# Patient Record
Sex: Male | Born: 1945 | Race: White | Hispanic: No | Smoking: Former smoker
Health system: Southern US, Community
[De-identification: ages and names within clinical notes are randomized; demographics above are authoritative.]

## PROBLEM LIST (undated history)

## (undated) DIAGNOSIS — E119 Type 2 diabetes mellitus without complications: Secondary | ICD-10-CM

## (undated) DIAGNOSIS — F32A Depression, unspecified: Secondary | ICD-10-CM

## (undated) DIAGNOSIS — N400 Enlarged prostate without lower urinary tract symptoms: Secondary | ICD-10-CM

## (undated) DIAGNOSIS — Z8679 Personal history of other diseases of the circulatory system: Secondary | ICD-10-CM

## (undated) DIAGNOSIS — M1990 Unspecified osteoarthritis, unspecified site: Secondary | ICD-10-CM

## (undated) DIAGNOSIS — I10 Essential (primary) hypertension: Secondary | ICD-10-CM

## (undated) DIAGNOSIS — F329 Major depressive disorder, single episode, unspecified: Secondary | ICD-10-CM

## (undated) DIAGNOSIS — G4730 Sleep apnea, unspecified: Secondary | ICD-10-CM

## (undated) DIAGNOSIS — R0600 Dyspnea, unspecified: Secondary | ICD-10-CM

## (undated) DIAGNOSIS — E039 Hypothyroidism, unspecified: Secondary | ICD-10-CM

## (undated) DIAGNOSIS — Z8719 Personal history of other diseases of the digestive system: Secondary | ICD-10-CM

## (undated) HISTORY — PX: SHOULDER SURGERY: SHX246

## (undated) HISTORY — PX: OTHER SURGICAL HISTORY: SHX169

## (undated) HISTORY — PX: NEPHRECTOMY: SHX65

## (undated) HISTORY — PX: JOINT REPLACEMENT: SHX530

## (undated) HISTORY — PX: HERNIA REPAIR: SHX51

---

## 1998-06-01 ENCOUNTER — Other Ambulatory Visit: Admission: RE | Admit: 1998-06-01 | Discharge: 1998-06-01 | Payer: Self-pay

## 2000-12-08 ENCOUNTER — Encounter: Payer: Self-pay | Admitting: Orthopedic Surgery

## 2000-12-08 ENCOUNTER — Encounter: Admission: RE | Admit: 2000-12-08 | Discharge: 2000-12-08 | Payer: Self-pay | Admitting: Orthopedic Surgery

## 2002-02-08 ENCOUNTER — Encounter: Payer: Self-pay | Admitting: Orthopedic Surgery

## 2002-02-09 ENCOUNTER — Inpatient Hospital Stay (HOSPITAL_COMMUNITY): Admission: RE | Admit: 2002-02-09 | Discharge: 2002-02-12 | Payer: Self-pay | Admitting: Orthopedic Surgery

## 2003-02-11 ENCOUNTER — Ambulatory Visit (HOSPITAL_COMMUNITY): Admission: RE | Admit: 2003-02-11 | Discharge: 2003-02-11 | Payer: Self-pay | Admitting: Gastroenterology

## 2007-05-18 ENCOUNTER — Inpatient Hospital Stay (HOSPITAL_COMMUNITY): Admission: RE | Admit: 2007-05-18 | Discharge: 2007-05-21 | Payer: Self-pay | Admitting: Orthopedic Surgery

## 2009-09-05 ENCOUNTER — Ambulatory Visit: Payer: Self-pay

## 2010-07-17 NOTE — Op Note (Signed)
NAMEJEROMEY, KRUER NO.:  [id number]   MEDICAL RECORD NO.:  [id number]          PATIENT TYPE:  INP   LOCATION:  2899                         FACILITY:  [facility]   PHYSICIAN:  Mila Homer. Sherlean Foot, M.D. DATE OF BIRTH:  04/08/1945   DATE OF PROCEDURE:  05/18/2007  DATE OF DISCHARGE:                               OPERATIVE REPORT   SURGEON:  Mila Homer. Sherlean Foot, M.D.   ASSISTANT:  Legrand Pitts. Duffy, P.A.   ANESTHESIA:  General.   PREOPERATIVE DIAGNOSIS:  Left knee osteoarthritis.   POSTOPERATIVE DIAGNOSIS:  Left knee osteoarthritis.   PROCEDURE:  Left total knee arthroplasty.   INDICATIONS FOR PROCEDURE:  The patient is 62-year white male with  failure of conservative measures for osteoarthritis of the left knee.  Informed consent obtained.   DESCRIPTION OF PROCEDURE:  The patient was laid supine and administered  general anesthesia and Foley catheter placed.  The left leg was prepped  and draped in the usual sterile fashion.  The extremity was  exsanguinated with the Esmarch and the tourniquet inflated to 350 mmHg  and set for an hour.   A fresh 10 blade was used to make a midline incision 6 inches in length.  A new 10 blade was used to make a median parapatellar arthrotomy and  perform a synovectomy.  I everted the patella and measured 25 mm thick.  I reamed down 9 mm with the 35 reamer and then drilled 3 lug holes and  with the 35 template in place recreated the 25 thickness.  I then went  into flexion.  I used the extramedullary alignment system on the tibia  and the intramedullary alignment system on the femur to make  perpendicular cuts in the anatomic axis of the tibia with 6-degree  valgus distal femoral cut on the femur.  I then sized to a size G pinned  to the 3-degree external rotation holes.  I then placed the four-in-one  cutting block into place and made the anterior, posterior, and chamfer  cuts.  I then placed the lamina spreader in the knee and  removed the  ACL, PCL, medial and lateral menisci, and posterior condylar  osteophytes.  I then placed a 10-mm spacer block in the knee had good  flexion/extension gap balance.  I then finished the femur with a size G  finishing block in the tibia with a size 5 tibial tray drilling keel.  I  then trialed with a 5 tibia, G femur, 10 insert, 35 patella and had good  flexion/extension gap balance and good patellar tracking.  I removed the  trial components and copiously irrigated.  I then cemented in the  components, removed excess cement, and allowed the cement to harden with  the leg in extension.  I then left a Hemovac deep to the arthrotomy  coming out superolaterally and a pain catheter coming out supermedial  and superficial to the arthrotomy.  I let the tourniquet down, obtained  hemostasis, and copiously irrigated again.  I then closed the arthrotomy  with figure-of-eight # 1 Vicryl sutures, buried 0 Vicryl sutures,  and a  subcuticular 2-0 Vicryl stitch.  I placed skin staples, Xeroform  dressing, sponges, sterile Webril, and TED stocking.   COMPLICATIONS:  None.   DRAINS:  One pain catheter and one Hemovac.           ______________________________  Mila Homer. Sherlean Foot, M.D.     SDL/MEDQ  D:  05/18/2007  T:  05/18/2007  Job:  [id number]

## 2010-07-20 NOTE — Op Note (Signed)
NAME:  Charles Allison, Charles Allison                          ACCOUNT NO.:  [id number]   MEDICAL RECORD NO.:  [id number]                   PATIENT TYPE:  INP   LOCATION:  2888                                 FACILITY:  [facility]   PHYSICIAN:  Katy Fitch. Naaman Plummer., M.D.          DATE OF BIRTH:  06-25-1945   DATE OF PROCEDURE:  02/09/2002  DATE OF DISCHARGE:                                 OPERATIVE REPORT   PREOPERATIVE DIAGNOSIS:  End-stage glenohumeral degenerative arthritis, left  shoulder, with bone-on-bone arthropathy and large marginal osteophytes and  glenoid fossa and humeral head with arthrogram-proven intact rotator cuff  and known chondrocalcinosis of humeral head and rotator cuff tendinous  calcification.   POSTOPERATIVE DIAGNOSIS:  End-stage glenohumeral degenerative arthritis,  left shoulder, with bone-on-bone arthropathy and large marginal osteophytes  and glenoid fossa and humeral head with arthrogram-proven intact rotator  cuff and known chondrocalcinosis of humeral head and rotator cuff tendinous  calcification.   OPERATION:  Left Bio-Modular total shoulder implant arthroplasty utilizing  an 11-mm press-fit porous-coated stem, a 54 x 22-mm cobalt chrome head and a  medium 4-mm-thick polyethylene glenoid.   OPERATING SURGEON:  Katy Fitch. Sypher, M.D.   ASSISTANT:  Jonni Sanger, P.A.   ANESTHESIA:  General orotracheal.   SUPERVISING ANESTHESIOLOGIST:  Maren Beach, M.D.   ESTIMATED BLOOD LOSS:  350 cc, no replacement.   FLUIDS:  See anesthesia sheets.   COMPLICATIONS:  There were no apparent complications.   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   He has had progressive bilateral shoulder arthritis, left worse than right.   He presented for evaluation and management of his shoulder more than six  months ago and was noted at that time to have bone-on-bone arthropathy.   In  2002, he had had an arthrogram performed of his shoulder by Dr. Lenard Galloway.  Mortenson, confirming an intact rotator cuff.   He has seen a number of physicians, who have all recommended proceeding with  implant arthroplasty of the left shoulder with primary indication -- pain  relief, secondary indication -- regaining improved motion, strength and  prehensile function.   After informed consent, he has elected to proceed with reconstructive  surgery through the [facility] [place].   Preoperative questions were invited and answered.  He was informed of  potential risks and benefits of surgery including anesthetic risks, risks of  infection, loosening of the implants, fracture of the implants, polyethylene  wear and the need for later revision due to his age of 57 years.   After his questions were satisfactorily answered, and his preoperative  complications, we proceed with surgery at this time.   DESCRIPTION OF PROCEDURE:  The patient was brought to the operating room and  placed in supine position on the operating table.   Following induction of general orotracheal anesthesia, he was  carefully  positioned in a beach chair position with the aid of a torso and head holder  designed specifically for patients weighing up to 350 pounds for shoulder  arthroscopy and reconstructive shoulder surgery.   All bony prominences were carefully protected, followed by administration of  1 g of Ancef as an IV prophylactic antibiotic.   A foam pad was placed beneath the left scapula to bring the glenoid forward  for glenoid preparation.   The entire left upper extremity and forequarter were prepped with Duraprep  and draped with appropriate arthroscopy drapes.   Procedure commenced with an incision extending from above the coracoid to  the pectoralis major insertion along the deltopectoral groove.  The interval  between the deltoid and pectoralis major muscle was carefully dissected with   cautery and Metzenbaum scissors until the cephalic vein was identified.   The cephalic vein was mobilized and retracted medially, taking care to  suture-ligate four perforators to the deltoid.   A Cobb elevator and sponge were used to clear the clavipectoral fascia,  followed by identification of the conjoined tendon.   A portion of the conjoined tendon to the coracobrachialis was released with  the cutting cautery.   The supraspinatus was easily visualized, utilizing an elevator deep to the  coracoacromial ligament.  A hypertrophic bursa was not noted.  The rotator  cuff appeared to be in satisfactory condition.   A small portion of the coracoacromial ligament was released off the coracoid  to facilitate visualization of the joint.   A self-retaining retractor was placed, taking care to protect the cephalic  vein.  The deltoid was elevated with blunt dissection and a Richardson-type  retractor was used to expose the humeral head.   A stay suture of #2 Kevlar was placed in the subscapularis and the cutting  cautery was used to take the subscapularis down off of the lesser  tuberosity.  Care was taken to identify the long head of the biceps and  protect it throughout dissection.   Given the patient's muscular habitus and weight in excess of 260 pounds, we  were quite challenged to visualize the glenohumeral joint.  Approximately  one-half of the pectoralis major tendon was released off of the humerus to  facilitate exposure and projection of the humeral head forward.   With progressive external rotation, the joint was entered.   A very large marginal osteophyte was noted surrounding the humeral articular  surface; this was meticulously removed with the 4- and 6-mm osteotomes until  the native shaft of the humerus was evident.  Great care was taken to palpate the axillary nerve and place a series of  curved Joker and Crego-type retractors to protect the axillary nerve   throughout dissection.  Great care was also taken to prevent excessive  external rotation, stretching the capsule.   Once the humeral head was carefully identified, a second curved Crego  retractor was placed deep to the rotator cuff and the cutting guide was used  to determine the proper position of the humeral head cut.   The humeral head was removed, placing the arm in a position of 30 degrees  external rotation at a 55 degree angle.  Very satisfactory resection was  achieved, removing all the periarticular osteophytes.   A rongeur was used to carefully nibble the posterior osteophytes on the  humeral neck.   The glenohumeral joint was carefully lavaged with sterile saline, followed  by placement of a Fukuda retractor to visualize the glenoid  and a pitchfork-  type retractor to expose the anteromedial glenoid.   There was complete loss of the hyaline articular cartilage in the glenoid  fossa and the humeral head was noted to be down to the subchondral bone.   The [employer] technical representative measured the head and determined that a  55 x 22-mm head replacement would likely be appropriate.   I elected to prepare the humeral stem and leave the trial in place while we  prepared the glenoid.   The humeral shaft was delivered, taking great care to protect the long head  of the biceps and supraspinatus tendon, followed by use of sequential  reamers to ream from 6 mm to an attempt at 12 mm.   The 12-mm reamer could not be seated, despite our preoperative x-ray  measurements; therefore, we elected to proceed with press-fit of an 11-mm  stem.   The appropriate broaches were used to size the proximal humerus to accept an  11-mm porous-coated implant, followed by careful lavage with sterile saline  and triple-antibiotic solution.  The trial was placed to protect the humerus  during glenoid preparation.   With the use of a Fukuda retractor to expose the posterior glenoid and an   anterior pitchfork, a power bur was used to decorticate the entire glenoid,  followed by creating a recess for cement in the shape of a medium 4-mm  glenoid polyethylene replacement.   Given the tight soft tissues, a metal-backed glenoid did not appear to be an  appropriate choice.   The glenoid was recessed to a level of bleeding cancellous bone, followed by  placement of four cement fixation drill holes and undermining to accept the  glenoid keel.   The medium glenoid implant was placed on a trial basis and appeared to seat  satisfactorily.  The trial head was placed on the stem and the joint  reduced.   A satisfactory reduction was achieved with elevation of the shoulder joint  of 60 degrees and external rotation with the subscapularis released to at  least 60 degrees.  The trial implants were removed, followed by lavage of the shaft with triple-  antibiotic solution and placement of an 11-mm stem with press-fit technique.  The glenoid was placed with bone cement and held into position for a total  of 13 minutes until the cement had thoroughly set.  The cement was cooled  with irrigation throughout curing.   Excess cement was removed from the periglenoid region with the use of small  curettes and osteotomes.   The glenoid was carefully lavaged, with removal of all residual  calcification visible with rongeurs and fine forceps.  The inferior capsule  was gently released off the neck of the humerus with a 4-mm osteotome,  taking care to avoid the axillary nerve.   This improved external rotation as well as elevation.   The cobalt chrome head was placed after routine preparation of the stem with  drying of the reversed Morse taper and use of a sponge to clear all debris.  The head was placed and tamped with three sharp taps with a mallet and the  appropriate impacter.  The joint was reduced and very satisfactory stability  was achieved.  There was only approximately a 15%  inferior translation with  inferior traction and external rotation to at least 60 degrees with proper  stability.   The wound was then prepared for closure by placing a total of three #2  Kevlar sutures, mattress technique,  into the anterior neck of the humerus,  followed by use of the stay suture to advance the subscapularis to an  anatomic position.   The subscapularis was repaired after placement of a 3-mm suction drain into  the inferior capsule for postoperative drainage of hematoma.   The capsule was repaired anatomically with a total of three through-bone  mattress sutures and a fourth finishing suture into the intertubercular  ligament.  Very satisfactory excursion of the subscapularis deep to the  coracoid was achieved.   The rotator interval was then smoothed with a mattress suture of #2 Kevlar  with knots buried.   External rotation after completion of the subscapularis repair was noted to  be only 20 degrees.  Forward elevation to 150 degrees was easily recovered.   The wound was then repaired with subdermal sutures of 2-0 Vicryl, followed  by an intradermal 3-0 Prolene.   The suction drain was hooked to a bulb and the wound dressed with Xeroflo,  sterile gauze and a Hypafix dressing.   For aftercare, the patient was awakened from general anesthesia and  transferred to the recovery room with stable vital signs.  He will admitted  to the hospital for IV antibiotic therapy times a minimum of 48 hours and  appropriate analgesics in the form of IV and PCA morphine and/or IV and p.o.  Dilaudid.   He will be monitored for routine care of his type 2 diabetes with cutaneous  blood glucose measurement, as well as serial observation of his hematocrit  x48 to 72 hours.                                                 Katy Fitch Naaman Plummer., M.D.    RVS/MEDQ  D:  02/09/2002  T:  02/09/2002  Job:  [id number]

## 2010-07-20 NOTE — Discharge Summary (Signed)
Charles Allison, Charles Allison NO.:  [id number]   MEDICAL RECORD NO.:  [id number]          PATIENT TYPE:  INP   LOCATION:  5008                         FACILITY:  [facility]   PHYSICIAN:  Oris Drone. Petrarca, P.A.-C.DATE OF BIRTH:  [birth year]   DATE OF ADMISSION:  05/18/2007  DATE OF DISCHARGE:  05/21/2007                               DISCHARGE SUMMARY   ADMISSION DIAGNOSIS:  Osteoarthritis to the left knee.   DISCHARGE DIAGNOSES:  1. Osteoarthritis to the left knee.  2. Osteoarthritis, right knee.  3. Hypertension.  4. Type 2 diabetes mellitus.  5. Benign prostatic hypertrophy.  6. Dyslipidemia.  7. Gastroesophageal reflux disease.  8. Obesity.  9. Hearing loss.  10.Sleep apnea.  11.Anxiety.  12.Degenerative disease of cervical spine.  13.Acute blood loss anemia.   PROCEDURE:  Left total knee arthroplasty.   HISTORY:  Mr. Mcgowan is a 65 year old white male who presents with  bilateral knee pain, left greater than right, it came on gradually and  has been getting  progressively worse.  He has a history of right knee  arthroscopy in 1989.  Denies any injuries.  His left knee, he has pain  that is constant and throbbing and aching over the anterior joint  without radiation.  Severity does vary.  Increases with standing and  weightbearing and decreases with Tylenol and rest.  There is mechanical  symptoms of popping, grinding.  Edema and nighttime pain is also a  problem.  He started ibuprofen and Tylenol with some relief.  He has not  had any corticosteroids or viscous supplementation at this time.  Indicated for left total knee arthroplasty.   HOSPITAL COURSE:  A 65 year old male admitted on May 18, 2007, after  appropriate laboratory studies were obtained as well as 2 g Ancef IV on  call to the operating room.  He was taken to the operating room, where  he underwent a left total knee arthroplasty.  This was performed by Dr.  Georgena Spurling, assisted by Legrand Pitts.  Duffy, PA-C.  He tolerated the  procedure well.  He was continued on Ancef 1 g IV q.6 h. x3 doses.  He  was started on Lovenox 30 mg subcutaneous q.12 h. beginning on May 19, 2007, at 8 a.m.  Foley was placed intraoperatively.  CPM was started 0  to 9 degrees for 6 to 8 hours per day.  Consultation with PT/OT care  management were made.  Weightbearing as tolerated.  CPAP was used  postoperatively.  His metformin was held until his postop day 1 and then  it was restarted.  He was noted to have decrease in his platelet count  and a HIT screen was obtained on postop day 2, which was negative.  He  was weaned off his PCA.  Lovenox instructions were given.  Dietician  consultation was ordered.  Remainder of his hospital course was  uneventful except for hypokalemia, which was treated with 40 mEq of KCl  prior to his discharge.  Discharged to home on May 21, 2007, to return  back to the office 2 weeks postoperatively.  EKG was read as normal  sinus rhythm with nonspecific ST abnormality.   LABORATORY STUDIES:  Hemoglobin 14.5, hematocrit 42.9%, white count  5600, and platelets 184,000.  Discharge hemoglobin 10.9, hematocrit  31.9%, white count 5700, and platelets 143,000.  HIT screen was  negative.  Preop sodium 141, potassium 4.5, chloride 108, CO2 of 24,  glucose 174, BUN 21, creatinine 1.12, and GFR was greater than 60.  Discharge sodium 138, potassium 3.3, chloride 102, CO2 of 27, glucose  125, BUN 14, creatinine 0.90, and GFR greater than 60.  Preop total  protein 7.0, albumin 4.1, AST 26, ALT 25, ALP 54, total bilirubin 0.8,  and hemoglobin A1c was 6.2.  Urinalysis was benign for voided urine.  Blood type B positive and antibody screen negative.  No growth on urine  culture.   DISCHARGE INSTRUCTIONS:  He is to resume his on regular diet at home.  Increase his activity slowly.  Use his crutches, weightbearing as  tolerated.  He may shower on Friday.  No lifting or driving for 6  weeks.  CPM 0 to 9 degrees for 6 to 8 hours per day and increasing 5 to 10  degrees daily to 110 degrees.  He will follow blue knee instruction  sheet.   DISCHARGE MEDICATION:  1. Prescription for Lovenox 40 mg injecting at 8 a.m. daily.  Last      dose being on June 02, 2007.  2. Percocet 5/325 1 to 2 tablets every 4 hours as needed for pain.  3. Robaxin 500 mg 1 tablet every 6 hours as needed for spasm and      continue with all his preop medicines as indicated on the home      reconciliation sheet.   FOLLOWUP:  He is to follow up with Dr. Sherlean Foot Tuesday on June 02, 2007.  He was discharged in improved condition.      Oris Drone Petrarca, P.A.-C.     BDP/MEDQ  D:  06/09/2007  T:  06/09/2007  Job:  [id number]

## 2010-11-26 LAB — BASIC METABOLIC PANEL
BUN: 14
Calcium: 9.1
Chloride: 101
Chloride: 102
Creatinine, Ser: 1.02
Creatinine, Ser: 1.16
GFR calc Af Amer: >60
GFR calc Af Amer: >60
GFR calc non Af Amer: >60
GFR calc non Af Amer: >60
GFR calc non Af Amer: >60
Potassium: 3.3 — ABNORMAL LOW
Potassium: 3.7
Sodium: 138
Sodium: 139

## 2010-11-26 LAB — CROSSMATCH
ABO/RH(D): B POS
Antibody Screen: NEGATIVE

## 2010-11-26 LAB — HEMOGLOBIN A1C
Hgb A1c MFr Bld: 6.2 — ABNORMAL HIGH
Mean Plasma Glucose: 143

## 2010-11-26 LAB — URINE CULTURE
Colony Count: NO GROWTH
Culture: NO GROWTH

## 2010-11-26 LAB — COMPREHENSIVE METABOLIC PANEL
Albumin: 4.1
Alkaline Phosphatase: 54
BUN: 21
Chloride: 108
Glucose, Bld: 174 — ABNORMAL HIGH
Potassium: 4.5
Total Bilirubin: 0.8

## 2010-11-26 LAB — CBC
HCT: 42.9
HCT: 31.9 — ABNORMAL LOW
HCT: 34.1 — ABNORMAL LOW
Hemoglobin: 14.5
Hemoglobin: 10.9 — ABNORMAL LOW
Hemoglobin: 12.7 — ABNORMAL LOW
MCV: 86.6
MCV: 87
Platelets: 184
Platelets: 143 — ABNORMAL LOW
RBC: 3.94 — ABNORMAL LOW
RBC: 4.27
WBC: 5.6
WBC: 5.7
WBC: 6.9

## 2010-11-26 LAB — DIFFERENTIAL
Basophils Absolute: 0
Basophils Relative: 1
Eosinophils Absolute: 0.1
Monocytes Absolute: 0.4
Neutro Abs: 3.1

## 2010-11-26 LAB — URINALYSIS, ROUTINE W REFLEX MICROSCOPIC
Bilirubin Urine: NEGATIVE
Glucose, UA: NEGATIVE
Hgb urine dipstick: NEGATIVE
Ketones, ur: NEGATIVE
pH: 5

## 2010-11-26 LAB — HEPARIN ANTIBODY SCREEN: Heparin Antibody Screen: NEGATIVE

## 2010-11-26 LAB — ABO/RH: ABO/RH(D): B POS

## 2010-11-26 LAB — APTT: aPTT: 33

## 2012-02-11 ENCOUNTER — Encounter (HOSPITAL_COMMUNITY): Payer: Self-pay | Admitting: Pharmacy Technician

## 2012-02-11 ENCOUNTER — Other Ambulatory Visit: Payer: Self-pay | Admitting: Orthopedic Surgery

## 2012-02-14 ENCOUNTER — Encounter (HOSPITAL_COMMUNITY)
Admission: RE | Admit: 2012-02-14 | Discharge: 2012-02-14 | Disposition: A | Discharge status: Home / Self Care | Payer: Non-veteran care | Source: Ambulatory Visit | Attending: Orthopedic Surgery | Admitting: Orthopedic Surgery

## 2012-02-14 ENCOUNTER — Encounter (HOSPITAL_COMMUNITY): Payer: Self-pay

## 2012-02-14 HISTORY — DX: Essential (primary) hypertension: I10

## 2012-02-14 HISTORY — DX: Sleep apnea, unspecified: G47.30

## 2012-02-14 HISTORY — DX: Unspecified osteoarthritis, unspecified site: M19.90

## 2012-02-14 HISTORY — DX: Personal history of other diseases of the digestive system: Z87.19

## 2012-02-14 HISTORY — DX: Type 2 diabetes mellitus without complications: E11.9

## 2012-02-14 LAB — CBC WITH DIFFERENTIAL/PLATELET
Basophils Absolute: 0 10*3/uL (ref 0.0–0.1)
Basophils Relative: 0 % (ref 0–1)
Eosinophils Relative: 2 % (ref 0–5)
Lymphocytes Relative: 31 % (ref 12–46)
MCHC: 33.2 g/dL (ref 30.0–36.0)
MCV: 86.7 fL (ref 78.0–100.0)
Neutro Abs: 3.7 10*3/uL (ref 1.7–7.7)
Platelets: 161 10*3/uL (ref 150–400)
RDW: 13.7 % (ref 11.5–15.5)
WBC: 6.1 10*3/uL (ref 4.0–10.5)

## 2012-02-14 LAB — PROTIME-INR: INR: 1.04 (ref 0.00–1.49)

## 2012-02-14 LAB — URINALYSIS, ROUTINE W REFLEX MICROSCOPIC
Glucose, UA: NEGATIVE mg/dL
Leukocytes, UA: NEGATIVE
Nitrite: NEGATIVE
Specific Gravity, Urine: 1.021 (ref 1.005–1.030)
pH: 5.5 (ref 5.0–8.0)

## 2012-02-14 LAB — APTT: aPTT: 34 seconds (ref 24–37)

## 2012-02-14 LAB — COMPREHENSIVE METABOLIC PANEL
ALT: 44 U/L (ref 0–53)
AST: 39 U/L — ABNORMAL HIGH (ref 0–37)
Albumin: 3.9 g/dL (ref 3.5–5.2)
CO2: 28 mEq/L (ref 19–32)
Calcium: 10.1 mg/dL (ref 8.4–10.5)
GFR calc non Af Amer: 73 mL/min — ABNORMAL LOW (ref >90)
Sodium: 140 mEq/L (ref 135–145)

## 2012-02-14 LAB — TYPE AND SCREEN: Antibody Screen: NEGATIVE

## 2012-02-14 NOTE — Pre-Procedure Instructions (Signed)
20 DONTRAIL BLACKWELL  02/14/2012   Your procedure is scheduled on:  Monday, December 16th  Report to [facility] at 5:30 AM.  Call this number if you have problems the morning of surgery: [phone]   Remember:   Do not eat food or drink any liquids:After Midnight Sunday.   Take these medicines the morning of surgery with A SIP OF WATER: Metoprolol, Omeprazole, Prozac   Do not wear jewelry.  Do not wear lotions, powders, or cologne. You may NOT wear deodorant.  Men may shave face and neck.   Do not bring valuables to the hospital.  Contacts, dentures or bridgework may not be worn into surgery.   Leave suitcase in the car. After surgery it may be brought to your room.  For patients admitted to the hospital, checkout time is 11:00 AM the day of discharge.   Patients discharged the day of surgery will not be allowed to drive home.  Name and phone number of your driver:    Special Instructions: Shower using CHG 2 nights before surgery and the night before surgery.  If you shower the day of surgery use CHG.  Use special wash - you have one bottle of CHG for all showers.  You should use approximately 1/3 of the bottle for each shower.   Please read over the following fact sheets that you were given: Pain Booklet, Coughing and Deep Breathing, Blood Transfusion Information, MRSA Information and Surgical Site Infection Prevention

## 2012-02-16 LAB — URINE CULTURE: Colony Count: NO GROWTH

## 2012-02-16 MED ORDER — ACETAMINOPHEN 10 MG/ML IV SOLN
1000.0000 mg | Freq: Four times a day (QID) | INTRAVENOUS | Status: DC
  Administered 2012-02-17: 1000 mg via INTRAVENOUS
  Filled 2012-02-16 (×4): qty 100

## 2012-02-16 MED ORDER — CHLORHEXIDINE GLUCONATE 4 % EX LIQD: 60.0000 mL | Freq: Once | CUTANEOUS | Status: DC

## 2012-02-16 MED ORDER — DEXTROSE 5 % IV SOLN
3.0000 g | INTRAVENOUS | Status: AC
  Administered 2012-02-17: 3 g via INTRAVENOUS
  Filled 2012-02-16: qty 3000

## 2012-02-16 MED ORDER — SODIUM CHLORIDE 0.9 % IV SOLN: INTRAVENOUS | Status: DC

## 2012-02-17 ENCOUNTER — Encounter (HOSPITAL_COMMUNITY): Payer: Self-pay | Admitting: Anesthesiology

## 2012-02-17 ENCOUNTER — Inpatient Hospital Stay (HOSPITAL_COMMUNITY)
Admission: RE | Admit: 2012-02-17 | Discharge: 2012-02-20 | DRG: 470 | Disposition: A | Discharge status: Home / Self Care | Payer: Non-veteran care | Source: Ambulatory Visit | Attending: Orthopedic Surgery | Admitting: Orthopedic Surgery

## 2012-02-17 ENCOUNTER — Encounter (HOSPITAL_COMMUNITY): Payer: Self-pay | Admitting: Surgery

## 2012-02-17 ENCOUNTER — Ambulatory Visit (HOSPITAL_COMMUNITY): Payer: Non-veteran care | Admitting: Anesthesiology

## 2012-02-17 ENCOUNTER — Encounter (HOSPITAL_COMMUNITY)
Admission: RE | Disposition: A | Discharge status: Home / Self Care | Payer: Self-pay | Source: Ambulatory Visit | Attending: Orthopedic Surgery

## 2012-02-17 DIAGNOSIS — E119 Type 2 diabetes mellitus without complications: Secondary | ICD-10-CM | POA: Diagnosis present

## 2012-02-17 DIAGNOSIS — M1710 Unilateral primary osteoarthritis, unspecified knee: Principal | ICD-10-CM | POA: Diagnosis present

## 2012-02-17 DIAGNOSIS — Z79899 Other long term (current) drug therapy: Secondary | ICD-10-CM

## 2012-02-17 DIAGNOSIS — D62 Acute posthemorrhagic anemia: Secondary | ICD-10-CM | POA: Diagnosis not present

## 2012-02-17 DIAGNOSIS — Z96619 Presence of unspecified artificial shoulder joint: Secondary | ICD-10-CM

## 2012-02-17 DIAGNOSIS — Z96659 Presence of unspecified artificial knee joint: Secondary | ICD-10-CM

## 2012-02-17 DIAGNOSIS — M1711 Unilateral primary osteoarthritis, right knee: Secondary | ICD-10-CM

## 2012-02-17 DIAGNOSIS — Z87891 Personal history of nicotine dependence: Secondary | ICD-10-CM

## 2012-02-17 DIAGNOSIS — I10 Essential (primary) hypertension: Secondary | ICD-10-CM | POA: Diagnosis present

## 2012-02-17 HISTORY — DX: Depression, unspecified: F32.A

## 2012-02-17 HISTORY — DX: Major depressive disorder, single episode, unspecified: F32.9

## 2012-02-17 HISTORY — DX: Personal history of other diseases of the circulatory system: Z86.79

## 2012-02-17 HISTORY — PX: TOTAL KNEE ARTHROPLASTY: SHX125

## 2012-02-17 LAB — HEMOGLOBIN A1C
Hgb A1c MFr Bld: 8.1 % — ABNORMAL HIGH (ref <5.7)
Mean Plasma Glucose: 186 mg/dL — ABNORMAL HIGH (ref <117)

## 2012-02-17 SURGERY — ARTHROPLASTY, KNEE, TOTAL
Anesthesia: Regional | Site: Knee | Laterality: Right | Wound class: Clean

## 2012-02-17 MED ORDER — ONDANSETRON HCL 4 MG/2ML IJ SOLN: 4.0000 mg | Freq: Once | INTRAMUSCULAR | Status: DC | PRN

## 2012-02-17 MED ORDER — GLIPIZIDE 10 MG PO TABS
10.0000 mg | ORAL_TABLET | Freq: Every day | ORAL | Status: DC
  Administered 2012-02-17 – 2012-02-19 (×3): 10 mg via ORAL
  Filled 2012-02-17 (×5): qty 1

## 2012-02-17 MED ORDER — METOCLOPRAMIDE HCL 5 MG/ML IJ SOLN: 5.0000 mg | Freq: Three times a day (TID) | INTRAMUSCULAR | Status: DC | PRN

## 2012-02-17 MED ORDER — METOCLOPRAMIDE HCL 10 MG PO TABS: 5.0000 mg | ORAL_TABLET | Freq: Three times a day (TID) | ORAL | Status: DC | PRN

## 2012-02-17 MED ORDER — FLEET ENEMA 7-19 GM/118ML RE ENEM: 1.0000 | ENEMA | Freq: Once | RECTAL | Status: AC | PRN

## 2012-02-17 MED ORDER — DIAZEPAM 5 MG/ML IJ SOLN
5.0000 mg | Freq: Once | INTRAMUSCULAR | Status: AC
  Administered 2012-02-17: 5 mg via INTRAVENOUS

## 2012-02-17 MED ORDER — ONDANSETRON HCL 4 MG/2ML IJ SOLN: 4.0000 mg | Freq: Four times a day (QID) | INTRAMUSCULAR | Status: DC | PRN

## 2012-02-17 MED ORDER — HYDROMORPHONE HCL PF 1 MG/ML IJ SOLN
INTRAMUSCULAR | Status: AC
  Filled 2012-02-17: qty 2

## 2012-02-17 MED ORDER — GLYCOPYRROLATE 0.2 MG/ML IJ SOLN
INTRAMUSCULAR | Status: DC | PRN
  Administered 2012-02-17 (×2): 0.2 mg via INTRAVENOUS

## 2012-02-17 MED ORDER — METHOCARBAMOL 100 MG/ML IJ SOLN: 500.0000 mg | Freq: Four times a day (QID) | INTRAVENOUS | Status: DC | PRN

## 2012-02-17 MED ORDER — ARTIFICIAL TEARS OP OINT
TOPICAL_OINTMENT | OPHTHALMIC | Status: DC | PRN
  Administered 2012-02-17: 1 via OPHTHALMIC

## 2012-02-17 MED ORDER — NEOSTIGMINE METHYLSULFATE 1 MG/ML IJ SOLN
INTRAMUSCULAR | Status: DC | PRN
  Administered 2012-02-17: 1 mg via INTRAVENOUS

## 2012-02-17 MED ORDER — OXYCODONE HCL ER 10 MG PO T12A
10.0000 mg | EXTENDED_RELEASE_TABLET | Freq: Two times a day (BID) | ORAL | Status: DC
  Administered 2012-02-17 – 2012-02-18 (×3): 10 mg via ORAL
  Filled 2012-02-17 (×3): qty 1

## 2012-02-17 MED ORDER — BUPIVACAINE ON-Q PAIN PUMP (FOR ORDER SET NO CHG)
INJECTION | Status: AC
  Filled 2012-02-17: qty 1

## 2012-02-17 MED ORDER — ONDANSETRON HCL 4 MG PO TABS: 4.0000 mg | ORAL_TABLET | Freq: Four times a day (QID) | ORAL | Status: DC | PRN

## 2012-02-17 MED ORDER — LISINOPRIL 5 MG PO TABS
5.0000 mg | ORAL_TABLET | Freq: Every day | ORAL | Status: DC
  Administered 2012-02-17 – 2012-02-20 (×4): 5 mg via ORAL
  Filled 2012-02-17 (×5): qty 1

## 2012-02-17 MED ORDER — BUPIVACAINE-EPINEPHRINE 0.25% -1:200000 IJ SOLN
INTRAMUSCULAR | Status: DC | PRN
  Administered 2012-02-17: 20 mL

## 2012-02-17 MED ORDER — GLIPIZIDE 10 MG PO TABS
20.0000 mg | ORAL_TABLET | Freq: Every day | ORAL | Status: DC
  Administered 2012-02-18 – 2012-02-20 (×3): 20 mg via ORAL
  Filled 2012-02-17 (×5): qty 2

## 2012-02-17 MED ORDER — ACETAMINOPHEN 10 MG/ML IV SOLN
INTRAVENOUS | Status: AC
  Filled 2012-02-17: qty 100

## 2012-02-17 MED ORDER — CEFAZOLIN SODIUM-DEXTROSE 2-3 GM-% IV SOLR
2.0000 g | Freq: Four times a day (QID) | INTRAVENOUS | Status: AC
  Administered 2012-02-17 – 2012-02-18 (×2): 2 g via INTRAVENOUS
  Filled 2012-02-17 (×3): qty 50

## 2012-02-17 MED ORDER — PHENOL 1.4 % MT LIQD: 1.0000 | OROMUCOSAL | Status: DC | PRN

## 2012-02-17 MED ORDER — DIAZEPAM 5 MG PO TABS
5.0000 mg | ORAL_TABLET | Freq: Three times a day (TID) | ORAL | Status: DC | PRN
  Administered 2012-02-17 – 2012-02-20 (×2): 10 mg via ORAL
  Filled 2012-02-17 (×2): qty 2

## 2012-02-17 MED ORDER — GLIPIZIDE 10 MG PO TABS: 10.0000 mg | ORAL_TABLET | Freq: Two times a day (BID) | ORAL | Status: DC

## 2012-02-17 MED ORDER — ONDANSETRON HCL 4 MG/2ML IJ SOLN
INTRAMUSCULAR | Status: DC | PRN
  Administered 2012-02-17: 4 mg via INTRAVENOUS

## 2012-02-17 MED ORDER — DIPHENHYDRAMINE HCL 12.5 MG/5ML PO ELIX: 12.5000 mg | ORAL_SOLUTION | ORAL | Status: DC | PRN

## 2012-02-17 MED ORDER — BISACODYL 5 MG PO TBEC: 5.0000 mg | DELAYED_RELEASE_TABLET | Freq: Every day | ORAL | Status: DC | PRN

## 2012-02-17 MED ORDER — DIAZEPAM 5 MG/ML IJ SOLN
INTRAMUSCULAR | Status: AC
  Filled 2012-02-17: qty 2

## 2012-02-17 MED ORDER — MENTHOL 3 MG MT LOZG: 1.0000 | LOZENGE | OROMUCOSAL | Status: DC | PRN

## 2012-02-17 MED ORDER — FENTANYL CITRATE 0.05 MG/ML IJ SOLN
INTRAMUSCULAR | Status: DC | PRN
  Administered 2012-02-17: 150 ug via INTRAVENOUS
  Administered 2012-02-17 (×2): 50 ug via INTRAVENOUS

## 2012-02-17 MED ORDER — LACTATED RINGERS IV SOLN
INTRAVENOUS | Status: DC | PRN
  Administered 2012-02-17 (×2): via INTRAVENOUS

## 2012-02-17 MED ORDER — SENNOSIDES-DOCUSATE SODIUM 8.6-50 MG PO TABS: 1.0000 | ORAL_TABLET | Freq: Every evening | ORAL | Status: DC | PRN

## 2012-02-17 MED ORDER — BUPIVACAINE HCL (PF) 0.25 % IJ SOLN
INTRAMUSCULAR | Status: AC
  Filled 2012-02-17: qty 30

## 2012-02-17 MED ORDER — DOCUSATE SODIUM 100 MG PO CAPS
100.0000 mg | ORAL_CAPSULE | Freq: Two times a day (BID) | ORAL | Status: DC
  Administered 2012-02-17 – 2012-02-20 (×6): 100 mg via ORAL
  Filled 2012-02-17 (×7): qty 1

## 2012-02-17 MED ORDER — INSULIN ASPART 100 UNIT/ML ~~LOC~~ SOLN
0.0000 [IU] | Freq: Three times a day (TID) | SUBCUTANEOUS | Status: DC
  Administered 2012-02-18: 2 [IU] via SUBCUTANEOUS
  Administered 2012-02-18: 3 [IU] via SUBCUTANEOUS
  Administered 2012-02-18: 2 [IU] via SUBCUTANEOUS
  Administered 2012-02-19 – 2012-02-20 (×3): 3 [IU] via SUBCUTANEOUS
  Administered 2012-02-20: 2 [IU] via SUBCUTANEOUS

## 2012-02-17 MED ORDER — BUPIVACAINE 0.25 % ON-Q PUMP SINGLE CATH 300ML
300.0000 mL | INJECTION | Status: DC
Start: 2012-02-17 — End: 2012-02-17
  Administered 2012-02-17: 300 mL
  Filled 2012-02-17: qty 300

## 2012-02-17 MED ORDER — HYDROMORPHONE HCL PF 1 MG/ML IJ SOLN
1.0000 mg | INTRAMUSCULAR | Status: DC | PRN
  Administered 2012-02-17 – 2012-02-18 (×6): 1 mg via INTRAVENOUS
  Filled 2012-02-17 (×6): qty 1

## 2012-02-17 MED ORDER — PIOGLITAZONE HCL 45 MG PO TABS
45.0000 mg | ORAL_TABLET | Freq: Every day | ORAL | Status: DC
  Administered 2012-02-18 – 2012-02-20 (×3): 45 mg via ORAL
  Filled 2012-02-17 (×4): qty 1

## 2012-02-17 MED ORDER — ALUM & MAG HYDROXIDE-SIMETH 200-200-20 MG/5ML PO SUSP: 30.0000 mL | ORAL | Status: DC | PRN

## 2012-02-17 MED ORDER — LIDOCAINE HCL (CARDIAC) 20 MG/ML IV SOLN
INTRAVENOUS | Status: DC | PRN
  Administered 2012-02-17: 80 mg via INTRAVENOUS

## 2012-02-17 MED ORDER — PHENYLEPHRINE HCL 10 MG/ML IJ SOLN
INTRAMUSCULAR | Status: DC | PRN
  Administered 2012-02-17 (×2): 80 ug via INTRAVENOUS
  Administered 2012-02-17: 40 ug via INTRAVENOUS
  Administered 2012-02-17: 80 ug via INTRAVENOUS

## 2012-02-17 MED ORDER — SODIUM CHLORIDE 0.9 % IR SOLN
Status: DC | PRN
  Administered 2012-02-17: 4000 mL

## 2012-02-17 MED ORDER — ENOXAPARIN SODIUM 30 MG/0.3ML ~~LOC~~ SOLN
30.0000 mg | Freq: Two times a day (BID) | SUBCUTANEOUS | Status: DC
  Administered 2012-02-18 – 2012-02-20 (×5): 30 mg via SUBCUTANEOUS
  Filled 2012-02-17 (×7): qty 0.3

## 2012-02-17 MED ORDER — PANTOPRAZOLE SODIUM 40 MG PO TBEC
40.0000 mg | DELAYED_RELEASE_TABLET | Freq: Every day | ORAL | Status: DC
  Administered 2012-02-18 – 2012-02-20 (×3): 40 mg via ORAL
  Filled 2012-02-17 (×5): qty 1

## 2012-02-17 MED ORDER — ACETAMINOPHEN 10 MG/ML IV SOLN
1000.0000 mg | Freq: Four times a day (QID) | INTRAVENOUS | Status: AC
  Administered 2012-02-17 – 2012-02-18 (×4): 1000 mg via INTRAVENOUS
  Filled 2012-02-17 (×4): qty 100

## 2012-02-17 MED ORDER — DOXAZOSIN MESYLATE 8 MG PO TABS
8.0000 mg | ORAL_TABLET | Freq: Every day | ORAL | Status: DC
  Administered 2012-02-18 – 2012-02-19 (×3): 8 mg via ORAL
  Filled 2012-02-17 (×5): qty 1

## 2012-02-17 MED ORDER — FLUOXETINE HCL 20 MG PO CAPS
60.0000 mg | ORAL_CAPSULE | Freq: Every day | ORAL | Status: DC
  Administered 2012-02-18 – 2012-02-20 (×3): 60 mg via ORAL
  Filled 2012-02-17 (×3): qty 3

## 2012-02-17 MED ORDER — ROCURONIUM BROMIDE 100 MG/10ML IV SOLN
INTRAVENOUS | Status: DC | PRN
  Administered 2012-02-17: 50 mg via INTRAVENOUS

## 2012-02-17 MED ORDER — METFORMIN HCL 500 MG PO TABS
1000.0000 mg | ORAL_TABLET | Freq: Two times a day (BID) | ORAL | Status: DC
  Administered 2012-02-17 – 2012-02-20 (×6): 1000 mg via ORAL
  Filled 2012-02-17 (×9): qty 2

## 2012-02-17 MED ORDER — CELECOXIB 200 MG PO CAPS
200.0000 mg | ORAL_CAPSULE | Freq: Two times a day (BID) | ORAL | Status: DC
  Administered 2012-02-18 – 2012-02-20 (×6): 200 mg via ORAL
  Filled 2012-02-17 (×8): qty 1

## 2012-02-17 MED ORDER — SODIUM CHLORIDE 0.9 % IV SOLN
INTRAVENOUS | Status: DC
  Administered 2012-02-17 – 2012-02-19 (×2): via INTRAVENOUS

## 2012-02-17 MED ORDER — PROPOFOL 10 MG/ML IV BOLUS
INTRAVENOUS | Status: DC | PRN
  Administered 2012-02-17: 200 mg via INTRAVENOUS

## 2012-02-17 MED ORDER — METOPROLOL TARTRATE 25 MG PO TABS
25.0000 mg | ORAL_TABLET | Freq: Two times a day (BID) | ORAL | Status: DC
  Administered 2012-02-17 – 2012-02-20 (×6): 25 mg via ORAL
  Filled 2012-02-17 (×7): qty 1

## 2012-02-17 MED ORDER — ZOLPIDEM TARTRATE 5 MG PO TABS: 5.0000 mg | ORAL_TABLET | Freq: Every evening | ORAL | Status: DC | PRN

## 2012-02-17 MED ORDER — METFORMIN HCL 500 MG PO TABS: 1000.0000 mg | ORAL_TABLET | Freq: Two times a day (BID) | ORAL | Status: DC

## 2012-02-17 MED ORDER — METHOCARBAMOL 500 MG PO TABS
500.0000 mg | ORAL_TABLET | Freq: Four times a day (QID) | ORAL | Status: DC | PRN
  Administered 2012-02-17 – 2012-02-20 (×8): 500 mg via ORAL
  Filled 2012-02-17 (×8): qty 1

## 2012-02-17 MED ORDER — HYDROMORPHONE HCL PF 1 MG/ML IJ SOLN
0.2500 mg | INTRAMUSCULAR | Status: DC | PRN
  Administered 2012-02-17 (×7): 0.5 mg via INTRAVENOUS

## 2012-02-17 MED ORDER — SIMVASTATIN 10 MG PO TABS
10.0000 mg | ORAL_TABLET | Freq: Every day | ORAL | Status: DC
  Administered 2012-02-17 – 2012-02-19 (×3): 10 mg via ORAL
  Filled 2012-02-17 (×4): qty 1

## 2012-02-17 MED ORDER — OXYCODONE HCL 5 MG PO TABS
5.0000 mg | ORAL_TABLET | ORAL | Status: DC | PRN
  Administered 2012-02-17: 10 mg via ORAL
  Administered 2012-02-18: 5 mg via ORAL
  Administered 2012-02-18: 10 mg via ORAL
  Filled 2012-02-17: qty 1
  Filled 2012-02-17 (×2): qty 2

## 2012-02-17 MED ORDER — LIDOCAINE HCL 4 % MT SOLN
OROMUCOSAL | Status: DC | PRN
  Administered 2012-02-17: 4 mL via TOPICAL

## 2012-02-17 MED ORDER — METHOCARBAMOL 100 MG/ML IJ SOLN
500.0000 mg | INTRAVENOUS | Status: AC
  Administered 2012-02-17: 500 mg via INTRAVENOUS
  Filled 2012-02-17: qty 5

## 2012-02-17 MED ORDER — ACETAMINOPHEN 325 MG PO TABS
650.0000 mg | ORAL_TABLET | Freq: Four times a day (QID) | ORAL | Status: DC | PRN
  Administered 2012-02-19: 650 mg via ORAL
  Filled 2012-02-17: qty 2

## 2012-02-17 MED ORDER — ACETAMINOPHEN 650 MG RE SUPP: 650.0000 mg | Freq: Four times a day (QID) | RECTAL | Status: DC | PRN

## 2012-02-17 MED ORDER — FAMOTIDINE 20 MG PO TABS
20.0000 mg | ORAL_TABLET | Freq: Every day | ORAL | Status: DC
  Administered 2012-02-17 – 2012-02-20 (×4): 20 mg via ORAL
  Filled 2012-02-17 (×4): qty 1

## 2012-02-17 SURGICAL SUPPLY — 57 items
BANDAGE ESMARK 6X9 LF (GAUZE/BANDAGES/DRESSINGS) ×1 IMPLANT
BLADE SAGITTAL 13X1.27X60 (BLADE) ×2 IMPLANT
BLADE SAW SGTL 83.5X18.5 (BLADE) ×2 IMPLANT
BNDG ESMARK 6X9 LF (GAUZE/BANDAGES/DRESSINGS) ×2
BOWL SMART MIX CTS (DISPOSABLE) ×2 IMPLANT
CATH KIT ON Q 5IN SLV (PAIN MANAGEMENT) ×2 IMPLANT
CEMENT BONE SIMPLEX SPEEDSET (Cement) ×4 IMPLANT
CLOTH BEACON ORANGE TIMEOUT ST (SAFETY) ×2 IMPLANT
COVER BACK TABLE 24X17X13 BIG (DRAPES) IMPLANT
COVER SURGICAL LIGHT HANDLE (MISCELLANEOUS) ×2 IMPLANT
CUFF TOURNIQUET SINGLE 34IN LL (TOURNIQUET CUFF) ×2 IMPLANT
DRAPE EXTREMITY T 121X128X90 (DRAPE) ×2 IMPLANT
DRAPE INCISE IOBAN 66X45 STRL (DRAPES) ×4 IMPLANT
DRAPE PROXIMA HALF (DRAPES) ×2 IMPLANT
DRAPE U-SHAPE 47X51 STRL (DRAPES) ×2 IMPLANT
DRSG ADAPTIC 3X8 NADH LF (GAUZE/BANDAGES/DRESSINGS) ×2 IMPLANT
DRSG PAD ABDOMINAL 8X10 ST (GAUZE/BANDAGES/DRESSINGS) ×2 IMPLANT
DURAPREP 26ML APPLICATOR (WOUND CARE) ×4 IMPLANT
ELECT REM PT RETURN 9FT ADLT (ELECTROSURGICAL) ×2
ELECTRODE REM PT RTRN 9FT ADLT (ELECTROSURGICAL) ×1 IMPLANT
EVACUATOR 1/8 PVC DRAIN (DRAIN) ×2 IMPLANT
GLOVE BIOGEL M 7.0 STRL (GLOVE) IMPLANT
GLOVE BIOGEL PI IND STRL 7.5 (GLOVE) IMPLANT
GLOVE BIOGEL PI IND STRL 8.5 (GLOVE) ×1 IMPLANT
GLOVE BIOGEL PI INDICATOR 7.5 (GLOVE)
GLOVE BIOGEL PI INDICATOR 8.5 (GLOVE) ×1
GLOVE BIOGEL PI ORTHO PRO SZ8 (GLOVE) ×1
GLOVE PI ORTHO PRO STRL SZ8 (GLOVE) ×1 IMPLANT
GLOVE SURG ORTHO 8.0 STRL STRW (GLOVE) ×4 IMPLANT
GOWN PREVENTION PLUS XLARGE (GOWN DISPOSABLE) ×4 IMPLANT
GOWN STRL NON-REIN LRG LVL3 (GOWN DISPOSABLE) ×4 IMPLANT
HANDPIECE INTERPULSE COAX TIP (DISPOSABLE) ×1
HOOD PEEL AWAY FACE SHEILD DIS (HOOD) ×8 IMPLANT
KIT BASIN OR (CUSTOM PROCEDURE TRAY) ×2 IMPLANT
KIT ROOM TURNOVER OR (KITS) ×2 IMPLANT
MANIFOLD NEPTUNE II (INSTRUMENTS) ×2 IMPLANT
NEEDLE 22X1 1/2 (OR ONLY) (NEEDLE) IMPLANT
NS IRRIG 1000ML POUR BTL (IV SOLUTION) ×2 IMPLANT
PACK TOTAL JOINT (CUSTOM PROCEDURE TRAY) ×2 IMPLANT
PAD ARMBOARD 7.5X6 YLW CONV (MISCELLANEOUS) ×4 IMPLANT
PADDING CAST COTTON 6X4 STRL (CAST SUPPLIES) ×2 IMPLANT
POSITIONER HEAD PRONE TRACH (MISCELLANEOUS) ×2 IMPLANT
SET HNDPC FAN SPRY TIP SCT (DISPOSABLE) ×1 IMPLANT
SPONGE GAUZE 4X4 12PLY (GAUZE/BANDAGES/DRESSINGS) ×2 IMPLANT
STAPLER VISISTAT 35W (STAPLE) ×2 IMPLANT
SUCTION FRAZIER TIP 10 FR DISP (SUCTIONS) ×2 IMPLANT
SUT BONE WAX W31G (SUTURE) ×2 IMPLANT
SUT VIC AB 0 CTB1 27 (SUTURE) ×4 IMPLANT
SUT VIC AB 1 CT1 27 (SUTURE) ×2
SUT VIC AB 1 CT1 27XBRD ANBCTR (SUTURE) ×2 IMPLANT
SUT VIC AB 2-0 CT1 27 (SUTURE) ×2
SUT VIC AB 2-0 CT1 TAPERPNT 27 (SUTURE) ×2 IMPLANT
SYR CONTROL 10ML LL (SYRINGE) IMPLANT
TOWEL OR 17X24 6PK STRL BLUE (TOWEL DISPOSABLE) ×2 IMPLANT
TOWEL OR 17X26 10 PK STRL BLUE (TOWEL DISPOSABLE) ×2 IMPLANT
TRAY FOLEY CATH 14FR (SET/KITS/TRAYS/PACK) ×2 IMPLANT
WATER STERILE IRR 1000ML POUR (IV SOLUTION) ×6 IMPLANT

## 2012-02-17 NOTE — Op Note (Signed)
TOTAL KNEE REPLACEMENT OPERATIVE NOTE:  02/17/2012  9:17 PM  PATIENT:  Charles Allison  66 y.o. male  PRE-OPERATIVE DIAGNOSIS:  osteoarthritis right knee  POST-OPERATIVE DIAGNOSIS:  osteoarthritis right knee   PROCEDURE:  Procedure(s): TOTAL KNEE ARTHROPLASTY  SURGEON:  Surgeon(s): Dannielle Huh, MD  PHYSICIAN ASSISTANT: Altamese Cabal, [facility]  ANESTHESIA:   general  DRAINS: Hemovac and On-Q Marcaine Pain Pump  SPECIMEN: None  COUNTS:  Correct  TOURNIQUET:   Total Tourniquet Time Documented: Thigh (Right) - 59 minutes  DICTATION:  Indication for procedure:    The patient is a 66 y.o. male who has failed conservative treatment for osteoarthritis right knee.  Informed consent was obtained prior to anesthesia. The risks versus benefits of the operation were explain and in a way the patient can, and did, understand.   Description of procedure:     The patient was taken to the operating room and placed under anesthesia.  The patient was positioned in the usual fashion taking care that all body parts were adequately padded and/or protected.  I foley catheter was not placed.  A tourniquet was applied and the leg prepped and draped in the usual sterile fashion.  The extremity was exsanguinated with the esmarch and tourniquet inflated to 350 mmHg.  Pre-operative range of motion was normal.  The knee was in 3 degree of mild varus.  A midline incision approximately 6-7 inches long was made with a #10 blade.  A new blade was used to make a parapatellar arthrotomy going 2-3 cm into the quadriceps tendon, over the patella, and alongside the medial aspect of the patellar tendon.  A synovectomy was then performed with the #10 blade and forceps. I then elevated the deep MCL off the medial tibial metaphysis subperiosteally around to the semimembranosus attachment.    I everted the patella and used calipers to measure patellar thickness.  I used the reamer to ream down to appropriate thickness to  recreate the native thickness.  I then removed excess bone with the rongeur and sagittal saw.  I used the appropriately sized template and drilled the three lug holes.  I then put the trial in place and measured the thickness with the calipers to ensure recreation of the native thickness.  The trial was then removed and the patella subluxed and the knee brought into flexion.  A homan retractor was place to retract and protect the patella and lateral structures.  A Z-retractor was place medially to protect the medial structures.  The extra-medullary alignment system was used to make cut the tibial articular surface perpendicular to the anamotic axis of the tibia and in 3 degrees of posterior slope.  The cut surface and alignment jig was removed.  I then used the intramedullary alignment guide to make a 6 valgus cut on the distal femur.  I then marked out the epicondylar axis on the distal femur.  The posterior condylar axis measured 3 degrees.  I then used the anterior referencing sizer and measured the femur to be a size G.  The 4-In-1 cutting block was screwed into place in external rotation matching the posterior condylar angle, making our cuts perpendicular to the epicondylar axis.  Anterior, posterior and chamfer cuts were made with the sagittal saw.  The cutting block and cut pieces were removed.  A lamina spreader was placed in 90 degrees of flexion.  The ACL, PCL, menisci, and posterior condylar osteophytes were removed.  A 10 mm spacer blocked was found to offer good  flexion and extension gap balance after minimal in degree releasing.   The scoop retractor was then placed and the femoral finishing block was pinned in place.  The small sagittal saw was used as well as the lug drill to finish the femur.  The block and cut surfaces were removed and the medullary canal hole filled with autograft bone from the cut pieces.  The tibia was delivered forward in deep flexion and external rotation.  A size 6  tray was selected and pinned into place centered on the medial 1/3 of the tibial tubercle.  The reamer and keel was used to prepare the tibia through the tray.    I then trialed with the size G femur, size 6 tibia, a 10 mm insert and the 35 patella.  I had excellent flexion/extension gap balance, excellent patella tracking.  Flexion was full and beyond 120 degrees; extension was zero.  These components were chosen and the staff opened them to me on the back table while the knee was lavaged copiously and the cement mixed.  I cemented in the components and removed all excess cement.  The polyethylene tibial component was snapped into place and the knee placed in extension while cement was hardening.  The capsule was infilltrated with 20cc of .25% Marcaine with epinephrine.  A hemovac was place in the joint exiting superolaterally.  A pain pump was place superomedially superficial to the arthrotomy.  Once the cement was hard, the tourniquet was let down.  Hemostasis was obtained.  The arthrotomy was closed with figure-8 #1 vicryl sutures.  The deep soft tissues were closed with #0 vicryls and the subcuticular layer closed with a running #2-0 vicryl.  The skin was reapproximated and closed with skin staples.  The wound was dressed with xeroform, 4 x4's, 2 ABD sponges, a single layer of webril and a TED stocking.   The patient was then awakened, extubated, and taken to the recovery room in stable condition.  BLOOD LOSS:  300cc DRAINS: 1 hemovac, 1 pain catheter COMPLICATIONS:  None.  PLAN OF CARE: Admit to inpatient   PATIENT DISPOSITION:  PACU - hemodynamically stable.   Delay start of Pharmacological VTE agent (>24hrs) due to surgical blood loss or risk of bleeding:  not applicable  Please fax a copy of this op note to my office at [phone] (please only include page 1 and 2 of the Case Information op note)

## 2012-02-17 NOTE — Transfer of Care (Signed)
Immediate Anesthesia Transfer of Care Note  Patient: Charles Allison  Procedure(s) Performed: Procedure(s) (LRB) with comments: TOTAL KNEE ARTHROPLASTY (Right)  Patient Location: PACU  Anesthesia Type:General and GA combined with regional for post-op pain  Level of Consciousness: awake, alert  and oriented  Airway & Oxygen Therapy: Patient Spontanous Breathing and Patient connected to nasal cannula oxygen  Post-op Assessment: Report given to PACU RN  Post vital signs: Reviewed and stable  Complications: No apparent anesthesia complications

## 2012-02-17 NOTE — H&P (Signed)
Charles Allison MRN:  [id number] DOB/SEX:  [birth year]/male  CHIEF COMPLAINT:  Painful right Knee  HISTORY: Patient is a 66 y.o. male presented with a history of pain in the right knee. Onset of symptoms was gradual starting several years ago with gradually worsening course since that time. The patient noted no past surgery on the right knee. Prior procedures on the knee include none. Patient has been treated conservatively with over-the-counter NSAIDs and activity modification. Patient currently rates pain in the knee at 10 out of 10 with activity. There is pain at night.  PAST MEDICAL HISTORY: There are no active problems to display for this patient.  Past Medical History  Diagnosis Date  . Diabetes mellitus without complication   . [facility]  . Sleep apnea     bi pap    > 5 yrs  last sleep study  . Arthritis   . H/O hiatal hernia    Past Surgical History  Procedure Date  . Joint replacement     lt shoulder, lt knee  . Hernia repair   . Carpel tu     bil     MEDICATIONS:   Prescriptions prior to admission  Medication Sig Dispense Refill  . aspirin EC 81 MG tablet Take 81 mg by mouth daily.      . cholecalciferol (VITAMIN D) 1000 UNITS tablet Take 1,000 Units by mouth daily.      Marland Kitchen doxazosin (CARDURA) 8 MG tablet Take 8 mg by mouth at bedtime.      Marland Kitchen FLUoxetine (PROZAC) 20 MG capsule Take 60 mg by mouth daily.      Marland Kitchen glipiZIDE (GLUCOTROL) 10 MG tablet Take 10-20 mg by mouth 2 (two) times daily before a meal. 2 tabs in the am, 1 tab in the evening      . lisinopril (PRINIVIL,ZESTRIL) 5 MG tablet Take 5 mg by mouth daily.      . metFORMIN (GLUCOPHAGE) 1000 MG tablet Take 1,000 mg by mouth 2 (two) times daily with a meal.      . metoprolol (LOPRESSOR) 50 MG tablet Take 25 mg by mouth 2 (two) times daily.      Marland Kitchen omeprazole (PRILOSEC) 20 MG capsule Take 20 mg by mouth daily.      . pioglitazone (ACTOS) 45 MG tablet Take 45 mg by mouth daily.      .  pravastatin (PRAVACHOL) 20 MG tablet Take 40 mg by mouth at bedtime.      . ranitidine (ZANTAC) 300 MG tablet Take 300 mg by mouth at bedtime.        ALLERGIES:  No Known Allergies  REVIEW OF SYSTEMS:  Pertinent items are noted in HPI.   FAMILY HISTORY:  History reviewed. No pertinent family history.  SOCIAL HISTORY:   History  Substance Use Topics  . Smoking status: Former Smoker    Quit date: 02/14/2000  . Smokeless tobacco: Not on file  . Alcohol Use: Yes     Comment: occ     EXAMINATION:  Vital signs in last 24 hours: Temp:  [98.1 F (36.7 C)] 98.1 F (36.7 C) (12/16 0701) Pulse Rate:  [60] 60  (12/16 0701) Resp:  [18] 18  (12/16 0701) BP: (134)/(86) 134/86 mmHg (12/16 0701) SpO2:  [96 %] 96 % (12/16 0701)  General appearance: alert, cooperative and no distress Lungs: clear to auscultation bilaterally Heart: regular rate and rhythm, S1, S2 normal, no murmur, click,  rub or gallop Abdomen: soft, non-tender; bowel sounds normal; no masses,  no organomegaly Extremities: extremities normal, atraumatic, no cyanosis or edema and Homans sign is negative, no sign of DVT Pulses: 2+ and symmetric Skin: Skin color, texture, turgor normal. No rashes or lesions Neurologic: Alert and oriented X 3, normal strength and tone. Normal symmetric reflexes. Normal coordination and gait  Musculoskeletal:  ROM 0-115, Ligaments intact,  Imaging Review Plain radiographs demonstrate severe degenerative joint disease of the right knee. The overall alignment is mild varus. The bone quality appears to be good for age and reported activity level.  Assessment/Plan: End stage arthritis, right knee   The patient history, physical examination and imaging studies are consistent with advanced degenerative joint disease of the right knee. The patient has failed conservative treatment.  The clearance notes were reviewed.  After discussion with the patient it was felt that Total Knee Replacement was  indicated. The procedure,  risks, and benefits of total knee arthroplasty were presented and reviewed. The risks including but not limited to aseptic loosening, infection, blood clots, vascular injury, stiffness, patella tracking problems complications among others were discussed. The patient acknowledged the explanation, agreed to proceed with the plan.  Tiare Rohlman 02/17/2012, 7:22 AM

## 2012-02-17 NOTE — Anesthesia Preprocedure Evaluation (Addendum)
Anesthesia Evaluation  Patient identified by MRN, date of birth, ID band Patient awake    Reviewed: Allergy & Precautions, H&P , NPO status , Patient's Chart, lab work & pertinent test results  Airway Mallampati: I TM Distance: >3 FB Neck ROM: full    Dental   Pulmonary sleep apnea ,          Cardiovascular hypertension, Pt. on medications and Pt. on home beta blockers Rhythm:regular Rate:Normal     Neuro/Psych    GI/Hepatic hiatal hernia,   Endo/Other  diabetes, Type 2, Oral Hypoglycemic Agents  Renal/GU      Musculoskeletal  (+) Arthritis -,   Abdominal   Peds  Hematology   Anesthesia Other Findings   Reproductive/Obstetrics                          Anesthesia Physical Anesthesia Plan  ASA: III  Anesthesia Plan: Regional and General   Post-op Pain Management: MAC Combined w/ Regional for Post-op pain   Induction: Intravenous  Airway Management Planned: Oral ETT  Additional Equipment:   Intra-op Plan:   Post-operative Plan: Extubation in OR  Informed Consent: I have reviewed the patients History and Physical, chart, labs and discussed the procedure including the risks, benefits and alternatives for the proposed anesthesia with the patient or authorized representative who has indicated his/her understanding and acceptance.   Dental advisory given  Plan Discussed with: CRNA, Anesthesiologist and Surgeon  Anesthesia Plan Comments:        Anesthesia Quick Evaluation

## 2012-02-17 NOTE — Anesthesia Procedure Notes (Signed)
Anesthesia Regional Block:  Femoral nerve block  Pre-Anesthetic Checklist: ,, timeout performed, Correct Patient, Correct Site, Correct Laterality, Correct Procedure, Correct Position, site marked, Risks and benefits discussed,  Surgical consent,  Pre-op evaluation,  At surgeon's request and post-op pain management  Laterality: Right  Prep: Maximum Sterile Barrier Precautions used, chloraprep and alcohol swabs       Needles:  Injection technique: Single-shot  Needle Type: Stimulator Needle - 80        Needle insertion depth: 6 cm   Additional Needles:  Procedures: nerve stimulator Femoral nerve block  Nerve Stimulator or Paresthesia:  Response: 0.5 mA, 0.1 ms, 6 cm  Additional Responses:   Narrative:  Start time: 02/17/2012 8:05 AM End time: 02/17/2012 8:10 AM Injection made incrementally with aspirations every 5 mL.  Performed by: Personally  Anesthesiologist: Maren Beach MD  Additional Notes: Pt accepts procedure and risks. 25cc 0.5% Marcaine w/ epi w/o difficulty or discomfort. Pt tolerated well. GES  Femoral nerve block

## 2012-02-17 NOTE — Progress Notes (Signed)
Orthopedic Tech Progress Note Patient Details:  Charles Allison 1945-06-24 [id number] CPM applied to Right LE with appropriate settings. No OHF available at this time.  CPM Right Knee CPM Right Knee: On Right Knee Flexion (Degrees): 90  Right Knee Extension (Degrees): 0    [name] 02/17/2012, 11:51 AM

## 2012-02-17 NOTE — Preoperative (Signed)
Beta Blockers   Reason not to administer Beta Blockers:Not Applicable 

## 2012-02-17 NOTE — Anesthesia Postprocedure Evaluation (Signed)
  Anesthesia Post-op Note  Patient: Charles Allison  Procedure(s) Performed: Procedure(s) (LRB) with comments: TOTAL KNEE ARTHROPLASTY (Right)  Patient Location: PACU  Anesthesia Type:GA combined with regional for post-op pain  Level of Consciousness: awake, oriented, sedated and patient cooperative  Airway and Oxygen Therapy: Patient Spontanous Breathing  Post-op Pain: moderate  Post-op Assessment: Post-op Vital signs reviewed, Patient's Cardiovascular Status Stable, Respiratory Function Stable, Patent Airway, No signs of Nausea or vomiting and Pain level controlled  Post-op Vital Signs: stable  Complications: No apparent anesthesia complications

## 2012-02-18 ENCOUNTER — Encounter (HOSPITAL_COMMUNITY): Payer: Self-pay | Admitting: Orthopedic Surgery

## 2012-02-18 LAB — BASIC METABOLIC PANEL
BUN: 16 mg/dL (ref 6–23)
Chloride: 100 mEq/L (ref 96–112)
GFR calc non Af Amer: 76 mL/min — ABNORMAL LOW (ref >90)
Glucose, Bld: 169 mg/dL — ABNORMAL HIGH (ref 70–99)
Potassium: 4.3 mEq/L (ref 3.5–5.1)
Sodium: 137 mEq/L (ref 135–145)

## 2012-02-18 LAB — GLUCOSE, CAPILLARY: Glucose-Capillary: 184 mg/dL — ABNORMAL HIGH (ref 70–99)

## 2012-02-18 LAB — CBC
HCT: 35 % — ABNORMAL LOW (ref 39.0–52.0)
Hemoglobin: 11.5 g/dL — ABNORMAL LOW (ref 13.0–17.0)
RBC: 4 MIL/uL — ABNORMAL LOW (ref 4.22–5.81)
WBC: 8.2 10*3/uL (ref 4.0–10.5)

## 2012-02-18 MED ORDER — OXYCODONE HCL 5 MG PO TABS: 10.0000 mg | ORAL_TABLET | ORAL | Status: DC | PRN

## 2012-02-18 MED ORDER — OXYCODONE HCL ER 10 MG PO T12A
10.0000 mg | EXTENDED_RELEASE_TABLET | Freq: Two times a day (BID) | ORAL | Status: DC
  Administered 2012-02-18 – 2012-02-19 (×2): 10 mg via ORAL
  Filled 2012-02-18 (×2): qty 1

## 2012-02-18 MED ORDER — OXYCODONE HCL 5 MG PO TABS
10.0000 mg | ORAL_TABLET | ORAL | Status: DC | PRN
  Administered 2012-02-18 – 2012-02-19 (×3): 15 mg via ORAL
  Administered 2012-02-19 – 2012-02-20 (×5): 10 mg via ORAL
  Filled 2012-02-18: qty 2
  Filled 2012-02-18: qty 3
  Filled 2012-02-18 (×2): qty 2
  Filled 2012-02-18 (×2): qty 3
  Filled 2012-02-18 (×2): qty 2
  Filled 2012-02-18: qty 3

## 2012-02-18 MED ORDER — OXYCODONE HCL ER 10 MG PO T12A: 20.0000 mg | EXTENDED_RELEASE_TABLET | Freq: Two times a day (BID) | ORAL | Status: DC

## 2012-02-18 NOTE — Evaluation (Signed)
Occupational Therapy Evaluation Patient Details Name: Charles Allison MRN: [id number] DOB: 12/25/1945 Today's Date: 02/18/2012 Time: 1478-2956 OT Time Calculation (min): 20 min  OT Assessment / Plan / Recommendation Clinical Impression  66 yo male s/p Rt TKA that could benefit from skilled OT acutely. OT recommend SNF for d/c planning    OT Assessment  Patient needs continued OT Services    Follow Up Recommendations  SNF    Barriers to Discharge      Equipment Recommendations  3 in 1 bedside comode;Wheelchair (measurements OT);Wheelchair cushion (measurements OT)    Recommendations for Other Services    Frequency  Min 2X/week    Precautions / Restrictions Precautions Precautions: Fall Restrictions RLE Weight Bearing: Weight bearing as tolerated   Pertinent Vitals/Pain Facial grimace  Pt falling asleep once supine in bed in CPM CPM 0-55     ADL  Eating/Feeding: Minimal assistance Where Assessed - Eating/Feeding: Chair Lower Body Dressing: +1 Total assistance Toilet Transfer: +2 Total assistance Toilet Transfer: Patient Percentage: 50% Toilet Transfer Method: Stand pivot Toilet Transfer Equipment: Raised toilet seat with arms (or 3-in-1 over toilet) Equipment Used: Rolling walker;Gait belt Transfers/Ambulation Related to ADLs: pt stand pivot to the left side with max v/c and max encouragement ADL Comments: pt with sustained attention of ~30 seconds. Pt with poor recall of all event from today. pt with slight buckling Rt LE with stand pivot. Pt could benefit from SNF due to sisters unable to provided total +2 (A)    OT Diagnosis: Generalized weakness;Acute pain  OT Problem List: Decreased strength;Decreased activity tolerance;Impaired balance (sitting and/or standing);Decreased knowledge of use of DME or AE;Decreased safety awareness;Decreased knowledge of precautions;Pain OT Treatment Interventions: Self-care/ADL training;DME and/or AE instruction;Therapeutic  activities;Balance training;Patient/family education   OT Goals Acute Rehab OT Goals OT Goal Formulation: With patient Time For Goal Achievement: 03/03/12 Potential to Achieve Goals: Good ADL Goals Pt Will Transfer to Toilet: 3-in-1;Stand pivot transfer;Extra wide 3-in-1 ADL Goal: Toilet Transfer - Progress: Goal set today Miscellaneous OT Goals Miscellaneous OT Goal #1: pt will complete bed mobility Min (A) as precursor for adls OT Goal: Miscellaneous Goal #1 - Progress: Goal set today  Visit Information  Last OT Received On: 02/18/12 Assistance Needed: +2 PT/OT Co-Evaluation/Treatment: Yes    Subjective Data  Subjective: "I have been sitting in this chair since last night"- pt with poor recall of days events Patient Stated Goal: pt unable to fully participate due to arousal. Sisters x2 present and requesting SNF placement now   Prior Functioning     Home Living Lives With: Alone Available Help at Discharge: Family;Available 24 hours/day Type of Home: House Home Access: Stairs to enter [employer] of Steps: 2 Entrance Stairs-Rails: Right Home Layout: One level Home Adaptive Equipment: Walker - rolling Prior Function Level of Independence: Independent Able to Take Stairs?: Yes Driving: Yes Communication Communication: No difficulties Dominant Hand: Right         Vision/Perception Vision - Assessment Eye Alignment: Within Functional Limits   Cognition  Overall Cognitive Status: Impaired Area of Impairment: Following commands;Safety/judgement;Awareness of errors;Awareness of deficits;Problem solving Arousal/Alertness: Lethargic Orientation Level: Appears intact for tasks assessed Behavior During Session: Lethargic Following Commands: Follows one step commands inconsistently Safety/Judgement: Decreased safety judgement for tasks assessed;Decreased awareness of need for assistance Awareness of Errors: Assistance required to identify errors  made;Assistance required to correct errors made Problem Solving: Slow to process.      Extremity/Trunk Assessment Right Upper Extremity Assessment RUE ROM/Strength/Tone: Within functional levels Left  Upper Extremity Assessment LUE ROM/Strength/Tone: Within functional levels Trunk Assessment Trunk Assessment: Normal     Mobility Bed Mobility Sit to Supine: 1: +2 Total assist;HOB flat Sit to Supine: Patient Percentage: 40% Details for Bed Mobility Assistance: pt using red gait belt as leg lifter and pt successfully demonstrating. Pt required assist to guide Rt LE and shoulders.  Transfers Sit to Stand: 1: +2 Total assist;With upper extremity assist;From chair/3-in-1 Sit to Stand: Patient Percentage: 50% Stand to Sit: 1: +2 Total assist;With upper extremity assist;To bed Stand to Sit: Patient Percentage: 50% Details for Transfer Assistance: cues for hand placement and sequence     Shoulder Instructions     Exercise     Balance     End of Session OT - End of Session Equipment Utilized During Treatment: Gait belt Activity Tolerance: Patient tolerated treatment well Patient left: with call bell/phone within reach;in bed;with family/visitor present Nurse Communication: Mobility status;Precautions  GO     Lucile Shutters 02/18/2012, 2:22 PM Pager: [phone]

## 2012-02-18 NOTE — Progress Notes (Signed)
Physical Therapy Treatment Patient Details Name: Charles Allison MRN: [id number] DOB: 07/22/1945 Today's Date: 02/18/2012 Time: 9147-8295 PT Time Calculation (min): 20 min  PT Assessment / Plan / Recommendation Comments on Treatment Session  pt rpesents with R TKA.  pt a little more alert this pm, but still drowsy and with decreased cognition.  pt's family now very adament about not wanting to take him home, but when SW discussed options with pt's [facility] benefits, family refused.  pt still requiring 2 person A for all mobility, would benefit from SNF for continued rehab, but if family continues to refuse, pt will need HHPT/OT/Aide.      Follow Up Recommendations  Home health PT;Supervision/Assistance - 24 hour (if family continues to refuse SNF)     Does the patient have the potential to tolerate intense rehabilitation     Barriers to Discharge        Equipment Recommendations  Rolling walker with 5" wheels (3-in-1)    Recommendations for Other Services    Frequency 7X/week   Plan Discharge plan remains appropriate;Frequency remains appropriate    Precautions / Restrictions Precautions Precautions: Fall Restrictions Weight Bearing Restrictions: Yes RLE Weight Bearing: Weight bearing as tolerated   Pertinent Vitals/Pain Indicates pain with mobility, but does not rate.    Mobility  Bed Mobility Bed Mobility: Sit to Supine Sit to Supine: 1: +2 Total assist;HOB flat Sit to Supine: Patient Percentage: 40% Details for Bed Mobility Assistance: pt using red gait belt as leg lifter and pt successfully demonstrating. Pt required assist to guide Rt LE and shoulders.  Transfers Sit to Stand: 1: +2 Total assist;With upper extremity assist;From chair/3-in-1 Sit to Stand: Patient Percentage: 50% Stand to Sit: 1: +2 Total assist;With upper extremity assist;To bed Stand to Sit: Patient Percentage: 50% Details for Transfer Assistance: cues for hand placement and  sequence Ambulation/Gait Ambulation/Gait Assistance: Not tested (comment) Stairs: No Wheelchair Mobility Wheelchair Mobility: No    Exercises     PT Diagnosis:    PT Problem List:   PT Treatment Interventions:     PT Goals Acute Rehab PT Goals Time For Goal Achievement: 02/25/12 Potential to Achieve Goals: Good PT Goal: Sit to Supine/Side - Progress: Progressing toward goal PT Goal: Sit to Stand - Progress: Progressing toward goal PT Goal: Stand to Sit - Progress: Progressing toward goal  Visit Information  Last PT Received On: 02/18/12 Assistance Needed: +2 PT/OT Co-Evaluation/Treatment: Yes    Subjective Data  Subjective: pt somewhat more alert this pm.     Cognition  Overall Cognitive Status: Impaired Area of Impairment: Following commands;Safety/judgement;Awareness of errors;Awareness of deficits;Problem solving Arousal/Alertness: Lethargic Orientation Level: Appears intact for tasks assessed Behavior During Session: Lethargic Following Commands: Follows one step commands inconsistently Safety/Judgement: Decreased safety judgement for tasks assessed;Decreased awareness of need for assistance Awareness of Errors: Assistance required to identify errors made;Assistance required to correct errors made Problem Solving: Slow to process.      Balance  Balance Balance Assessed: No  End of Session PT - End of Session Equipment Utilized During Treatment: Gait belt Activity Tolerance: Patient tolerated treatment well Patient left: in bed;in CPM;with call bell/phone within reach;with family/visitor present Nurse Communication: Mobility status   GP     [name], [place] [phone] 02/18/2012, 2:48 PM

## 2012-02-18 NOTE — Evaluation (Signed)
Physical Therapy Evaluation Patient Details Name: Charles Allison MRN: [id number] DOB: [birth year] Today's Date: 02/18/2012 Time: 4098-1191 PT Time Calculation (min): 32 min  PT Assessment / Plan / Recommendation Clinical Impression  pt presents with R TKA.  pt very lethargic and difficulty attending to task and following cues.  pt plans to D/C to mother's home with famioly A 24/7.      PT Assessment  Patient needs continued PT services    Follow Up Recommendations  Home health PT;Supervision/Assistance - 24 hour    Does the patient have the potential to tolerate intense rehabilitation      Barriers to Discharge None      Equipment Recommendations  Rolling walker with 5" wheels (3-in-1)    Recommendations for Other Services OT consult   Frequency 7X/week    Precautions / Restrictions Precautions Precautions: Fall Restrictions Weight Bearing Restrictions: Yes RLE Weight Bearing: Weight bearing as tolerated   Pertinent Vitals/Pain Pt indicates pain, however did not rate.        Mobility  Bed Mobility Bed Mobility: Supine to Sit;Sitting - Scoot to Edge of Bed Supine to Sit: 1: +2 Total assist;With rails Supine to Sit: Patient Percentage: 40% Sitting - Scoot to Edge of Bed: 3: Mod assist Details for Bed Mobility Assistance: Max cueing for safe technique, sequencing, attending to task.   Transfers Transfers: Sit to Stand;Stand to Sit;Stand Pivot Transfers Sit to Stand: 1: +2 Total assist;With upper extremity assist;From bed Sit to Stand: Patient Percentage: 50% Stand to Sit: 1: +2 Total assist;With upper extremity assist;To chair/3-in-1;With armrests Stand to Sit: Patient Percentage: 60% Stand Pivot Transfers: 1: +2 Total assist Stand Pivot Transfers: Patient Percentage: 60% Details for Transfer Assistance: cues for step-by0-step through transfer, use of RW, attending to task.   Ambulation/Gait Ambulation/Gait Assistance: Not tested (comment) Stairs: No Wheelchair  Mobility Wheelchair Mobility: No    Shoulder Instructions     Exercises Total Joint Exercises Ankle Circles/Pumps: AROM;Both;10 reps Quad Sets: AROM;Both;10 reps   PT Diagnosis: Abnormality of gait;Acute pain  PT Problem List: Decreased strength;Decreased range of motion;Decreased activity tolerance;Decreased balance;Decreased mobility;Decreased knowledge of use of DME;Decreased cognition;Decreased safety awareness;Pain PT Treatment Interventions: DME instruction;Gait training;Stair training;Functional mobility training;Therapeutic activities;Therapeutic exercise;Balance training;Patient/family education   PT Goals Acute Rehab PT Goals PT Goal Formulation: With patient/family Time For Goal Achievement: 02/25/12 Potential to Achieve Goals: Good Pt will go Supine/Side to Sit: with modified independence PT Goal: Supine/Side to Sit - Progress: Goal set today Pt will go Sit to Supine/Side: with modified independence PT Goal: Sit to Supine/Side - Progress: Goal set today Pt will go Sit to Stand: with modified independence PT Goal: Sit to Stand - Progress: Goal set today Pt will go Stand to Sit: with modified independence PT Goal: Stand to Sit - Progress: Goal set today Pt will Ambulate: >150 feet;with modified independence;with rolling walker PT Goal: Ambulate - Progress: Goal set today Pt will Go Up / Down Stairs: 1-2 stairs;with min assist;with least restrictive assistive device PT Goal: Up/Down Stairs - Progress: Goal set today Pt will Perform Home Exercise Program: with supervision, verbal cues required/provided PT Goal: Perform Home Exercise Program - Progress: Goal set today  Visit Information  Last PT Received On: 02/18/12 Assistance Needed: +2    Subjective Data  Subjective: pt very drowsy and needs cueing to maintain alertness.   Patient Stated Goal: Per sister to go to mother's home.     Prior Functioning  Home Living Lives With: Alone Available Help at  Discharge:  Family;Available 24 hours/day Type of Home: House Home Access: Stairs to enter [employer] of Steps: 2 Entrance Stairs-Rails: Right Home Layout: One level Home Adaptive Equipment: Walker - rolling Prior Function Level of Independence: Independent Able to Take Stairs?: Yes Driving: Yes Communication Communication: No difficulties    Cognition  Overall Cognitive Status: Impaired Area of Impairment: Following commands;Safety/judgement;Awareness of errors;Awareness of deficits;Problem solving Arousal/Alertness: Lethargic Orientation Level: Appears intact for tasks assessed Behavior During Session: Lethargic Following Commands: Follows one step commands inconsistently Safety/Judgement: Decreased safety judgement for tasks assessed;Decreased awareness of need for assistance Awareness of Errors: Assistance required to identify errors made;Assistance required to correct errors made Awareness of Deficits: pt unaware of need for A.   Problem Solving: Slow to process.      Extremity/Trunk Assessment Right Lower Extremity Assessment RLE ROM/Strength/Tone: Deficits RLE ROM/Strength/Tone Deficits: New TKA, difficult to assess.   RLE Sensation: WFL - Light Touch Left Lower Extremity Assessment LLE ROM/Strength/Tone: WFL for tasks assessed LLE Sensation: WFL - Light Touch Trunk Assessment Trunk Assessment: Normal   Balance Balance Balance Assessed: No  End of Session PT - End of Session Equipment Utilized During Treatment: Gait belt Activity Tolerance: Patient tolerated treatment well Patient left: in chair;with call bell/phone within reach;with family/visitor present Nurse Communication: Mobility status  GP     Charles Allison, Charles Allison [phone] 02/18/2012, 9:45 AM

## 2012-02-18 NOTE — Progress Notes (Signed)
SPORTS MEDICINE AND JOINT REPLACEMENT  Georgena Spurling, MD   Altamese Cabal, PA-C [address] [place], [place], [place]  [address]                             [phone]   PROGRESS NOTE  Subjective:  negative for Chest Pain  negative for Shortness of Breath  negative for Nausea/Vomiting   negative for Calf Pain  negative for Bowel Movement   Tolerating Diet: yes         Patient reports pain as 5 on 0-10 scale.    Objective: Vital signs in last 24 hours:   Patient Vitals for the past 24 hrs:  BP Temp Temp src Pulse Resp SpO2  02/18/12 1600 - - - - 18  96 %  02/18/12 1400 94/43 mmHg 97.4 F (36.3 C) - 61  20  94 %  02/18/12 1200 - - - - 18  98 %  02/18/12 0800 - - - - 18  -  02/18/12 0659 120/70 mmHg 98.1 F (36.7 C) - 60  18  96 %  02/17/12 2226 162/65 mmHg 98 F (36.7 C) Oral 73  18  94 %  02/17/12 1915 - - - - - 96 %  02/17/12 1840 - - - - - 96 %    @flow {1959:LAST@   Intake/Output from previous day:   12/16 0701 - 12/17 0700 In: 2046.3 [P.O.:660; I.V.:1286.3] Out: 1010 [Urine:500; Drains:460]   Intake/Output this shift:       Intake/Output      12/16 0701 - 12/17 0700 12/17 0701 - 12/18 0700   P.O. 660    I.V. 1286.3    IV Piggyback 100    Total Intake 2046.3    Urine 500    Drains 460    Blood 50    Total Output 1010    Net +1036.3         Urine Occurrence 2 x       LABORATORY DATA:  Basename 02/18/12 0600 02/14/12 1233  WBC 8.2 6.1  HGB 11.5* 13.9  HCT 35.0* 41.9  PLT 153 161    Basename 02/18/12 0600 02/14/12 1233  NA 137 140  K 4.3 4.9  CL 100 103  CO2 27 28  BUN 16 21  CREATININE 1.02 1.05  GLUCOSE 169* 123*  CALCIUM 8.9 10.1   Lab Results  Component Value Date   INR 1.04 02/14/2012   INR 1.0 05/14/2007    Examination:  General appearance: alert, cooperative and no distress Extremities: Homans sign is negative, no sign of DVT  Wound Exam: clean, dry, intact   Drainage:  None: wound tissue dry  Motor Exam: EHL and FHL  Intact  Sensory Exam: Deep Peroneal normal  Vascular Exam:    Assessment:    1 Day Post-Op  Procedure(s) (LRB): TOTAL KNEE ARTHROPLASTY (Right)  ADDITIONAL DIAGNOSIS:  Active Problems:  * No active hospital problems. *   Acute Blood Loss Anemia   Plan: Physical Therapy as ordered Weight Bearing as Tolerated (WBAT)  DVT Prophylaxis:  Lovenox  DISCHARGE PLAN: Home  DISCHARGE NEEDS: HHPT, CPM, Walker and 3-in-1 comode seat         Cam Dauphin 02/18/2012, 6:02 PM

## 2012-02-18 NOTE — Care Management Note (Addendum)
    Page 1 of 2   02/20/2012     3:56:01 PM   CARE MANAGEMENT NOTE 02/20/2012  Patient:  Charles Allison, Charles Allison   Account Number:  [id number]  Date Initiated:  02/18/2012  Documentation initiated by:  Anette Guarneri  Subjective/Objective Assessment:   POD#1 s/p right TKA  needs [facility] services and DME     Action/Plan:   home with [facility] services with 24 hr care   Anticipated DC Date:  02/20/2012   Anticipated DC Plan:  HOME W HOME HEALTH SERVICES      DC Planning Services  CM consult      [facility] Choice  HOME HEALTH   Choice offered to / List presented to:  C-1 Patient   DME arranged  BEDSIDE COMMODE  CPM  CANE  WALKER - ROLLING  OVER BED Philip Aspen      DME agency  [facility]     HH arranged  HH-2 PT  HH-4 NURSE'S AIDE      HH agency  [facility]   Status of service:  Completed, signed off Medicare Important Message given?  NO (If response is "NO", the following Medicare IM given date fields will be blank) Date Medicare IM given:   Date Additional Medicare IM given:    Discharge Disposition:  HOME W HOME HEALTH SERVICES  Per UR Regulation:    If discussed at Long Length of Stay Meetings, dates discussed:    Comments:  02/20/12 15:52 Letha Cape RN, BSN [phone] patient is for dc today, I spoke with [facility] the [facility] at [phone] ext [phone], she had prostectics on the line and the confirmed to her that they would have patient's DME (CPM, cane, 3 n1 , rolling walker and over head trapez) delivered to patient today to his mother's address   I informed the [facility] that patient has to have this DME. They verified the phone number that patient gave them.  [facility] also notified that patient is for dc today.  02/19/12 12:55 Letha Cape RN, BSN [phone] patient is for possible dc tomorrow , I spoke with April at the [place] and she states they have orders for DME for single point cand, rolling walker, 3 n 1, I informed her that they also need a overhead  frame and bar with trapez, she stated for me to call Arturo Morton at [phone] ext [phone] to see how the trapeze would be delivered,  I called and left Caryn Bee a message to call me back.  Patient will also need an aide added to hh services.  Patient's sister states that patient will have 24 hr care between her and her other sister Dorann Ou [phone] and Gunnar Fusi).  02/18/12  15:11 Anette Guarneri RN/CM patient is with [facility] spoke with Hoy Register w/[facility] ph# [phone] Patient has choosen Genevieve Norlander for HHPT, notified [facility] per [facility] w/[facility] they already have DME orders, faxed PT notes to [facility] per request, fax# [phone]

## 2012-02-18 NOTE — Progress Notes (Signed)
UR COMPLETED  

## 2012-02-19 ENCOUNTER — Encounter (HOSPITAL_COMMUNITY): Payer: Self-pay | Admitting: General Practice

## 2012-02-19 LAB — CBC
HCT: 31.2 % — ABNORMAL LOW (ref 39.0–52.0)
Hemoglobin: 10.3 g/dL — ABNORMAL LOW (ref 13.0–17.0)
MCH: 28.6 pg (ref 26.0–34.0)
MCHC: 33 g/dL (ref 30.0–36.0)
RDW: 14 % (ref 11.5–15.5)

## 2012-02-19 LAB — BASIC METABOLIC PANEL
BUN: 28 mg/dL — ABNORMAL HIGH (ref 6–23)
Calcium: 8.8 mg/dL (ref 8.4–10.5)
Creatinine, Ser: 1.49 mg/dL — ABNORMAL HIGH (ref 0.50–1.35)
GFR calc non Af Amer: 48 mL/min — ABNORMAL LOW (ref >90)
Glucose, Bld: 166 mg/dL — ABNORMAL HIGH (ref 70–99)

## 2012-02-19 LAB — GLUCOSE, CAPILLARY: Glucose-Capillary: 180 mg/dL — ABNORMAL HIGH (ref 70–99)

## 2012-02-19 MED ORDER — INFLUENZA VIRUS VACC SPLIT PF IM SUSP
0.5000 mL | INTRAMUSCULAR | Status: DC
  Filled 2012-02-19: qty 0.5

## 2012-02-19 MED ORDER — HYDROMORPHONE HCL 2 MG PO TABS
2.0000 mg | ORAL_TABLET | Freq: Four times a day (QID) | ORAL | Status: DC | PRN
  Administered 2012-02-20: 2 mg via ORAL
  Filled 2012-02-19: qty 1

## 2012-02-19 NOTE — Progress Notes (Signed)
Physical Therapy Treatment Patient Details Name: Charles Allison MRN: [id number] DOB: 03-18-1945 Today's Date: 02/19/2012 Time: 0865-7846 PT Time Calculation (min): 29 min  PT Assessment / Plan / Recommendation Comments on Treatment Session  Patient with R TKA, having increase lethargy and issues processing and retaining information. RN aware. Patient at this time not safe to discharge today. Refusing SNF at this time as it is too far away for family. Patient will requiring 24 hour A at discharge but will need to improve with mobiilty prior to DC home.     Follow Up Recommendations  Home health PT;Supervision/Assistance - 24 hour     Does the patient have the potential to tolerate intense rehabilitation     Barriers to Discharge        Equipment Recommendations  Rolling walker with 5" wheels;Other (comment) (3n1)    Recommendations for Other Services    Frequency 7X/week   Plan Discharge plan remains appropriate;Frequency remains appropriate    Precautions / Restrictions Precautions Precautions: Fall Restrictions RLE Weight Bearing: Weight bearing as tolerated   Pertinent Vitals/Pain     Mobility  Bed Mobility Supine to Sit: 4: Min assist Sitting - Scoot to Edge of Bed: 4: Min assist Details for Bed Mobility Assistance: A for R LE and with shoudlers up out of bed Transfers Sit to Stand: 1: +2 Total assist;With upper extremity assist;From bed;From chair/3-in-1 Sit to Stand: Patient Percentage: 70% Stand to Sit: 4: Min assist;With upper extremity assist;To bed;To chair/3-in-1 Details for Transfer Assistance: A for balance and to ensure stability. patient requiring A to stand upright and tuck in bottom with stand. Cues for technique throughout Ambulation/Gait Ambulation/Gait Assistance: 4: Min assist Ambulation Distance (Feet): 10 Feet Assistive device: Rolling walker Ambulation/Gait Assistance Details: Cues for gait sequence, RW management, posture and step length. No  buckling noted however very slow to process technique Gait Pattern: Step-to pattern;Antalgic;Decreased step length - right;Decreased step length - left Gait velocity: very decreased    Exercises Total Joint Exercises Ankle Circles/Pumps: AROM;Both;10 reps Quad Sets: AAROM;Right;10 reps   PT Diagnosis:    PT Problem List:   PT Treatment Interventions:     PT Goals Acute Rehab PT Goals PT Goal: Supine/Side to Sit - Progress: Progressing toward goal PT Goal: Sit to Stand - Progress: Progressing toward goal PT Goal: Stand to Sit - Progress: Progressing toward goal PT Goal: Ambulate - Progress: Progressing toward goal PT Goal: Perform Home Exercise Program - Progress: Progressing toward goal  Visit Information  Last PT Received On: 02/19/12 Assistance Needed: +1    Subjective Data      Cognition  Overall Cognitive Status: Impaired Area of Impairment: Following commands;Safety/judgement;Problem solving Arousal/Alertness: Lethargic Behavior During Session: Lethargic Following Commands: Follows one step commands inconsistently Awareness of Errors: Assistance required to identify errors made;Assistance required to correct errors made    Balance     End of Session PT - End of Session Equipment Utilized During Treatment: Gait belt Activity Tolerance: Patient limited by pain;Treatment limited secondary to medical complications (Comment) (lethargy) Patient left: in chair;with call bell/phone within reach;with family/visitor present Nurse Communication: Mobility status   GP     Fredrich Birks 02/19/2012, 9:10 AM 02/19/2012 Fredrich Birks PTA [phone] pager [phone] office

## 2012-02-19 NOTE — Progress Notes (Signed)
Pt said that he placed himself on/off BIPAP setting 10/5 with 2 LPM bled in.

## 2012-02-19 NOTE — Progress Notes (Signed)
SPORTS MEDICINE AND JOINT REPLACEMENT  Georgena Spurling, MD   Altamese Cabal, PA-C [address] [place], [place], [place]  [address]                             [phone]   PROGRESS NOTE  Subjective:  negative for Chest Pain  negative for Shortness of Breath  negative for Nausea/Vomiting   negative for Calf Pain  negative for Bowel Movement   Tolerating Diet: yes         Patient reports pain as 7 on 0-10 scale.    Objective: Vital signs in last 24 hours:   Patient Vitals for the past 24 hrs:  BP Temp Pulse Resp SpO2  02/19/12 0800 - - - 18  96 %  02/19/12 0542 110/55 mmHg 98.1 F (36.7 C) 68  22  94 %  02/18/12 2108 116/52 mmHg - - - -  02/18/12 2033 - - 67  20  97 %  02/18/12 1600 - - - 18  96 %  02/18/12 1400 94/43 mmHg 97.4 F (36.3 C) 61  20  94 %    @flow {1959:LAST@   Intake/Output from previous day:   12/17 0701 - 12/18 0700 In: 200 [P.O.:200] Out: -    Intake/Output this shift:       Intake/Output      12/17 0701 - 12/18 0700 12/18 0701 - 12/19 0700   P.O. 200    I.V.     IV Piggyback     Total Intake 200    Urine     Drains     Blood     Total Output     Net +200         Urine Occurrence 1 x       LABORATORY DATA:  Basename 02/19/12 0645 02/18/12 0600 02/14/12 1233  WBC 6.5 8.2 6.1  HGB 10.3* 11.5* 13.9  HCT 31.2* 35.0* 41.9  PLT 125* 153 161    Basename 02/19/12 0645 02/18/12 0600 02/14/12 1233  NA 133* 137 140  K 3.8 4.3 4.9  CL 98 100 103  CO2 25 27 28   BUN 28* 16 21  CREATININE 1.49* 1.02 1.05  GLUCOSE 166* 169* 123*  CALCIUM 8.8 8.9 10.1   Lab Results  Component Value Date   INR 1.04 02/14/2012   INR 1.0 05/14/2007    Examination:  General appearance: alert, cooperative and no distress Extremities: Homans sign is negative, no sign of DVT  Wound Exam: clean, dry, intact   Drainage:  None: wound tissue dry  Motor Exam: EHL and FHL Intact  Sensory Exam: Deep Peroneal normal  Vascular Exam:    Assessment:    2  Days Post-Op  Procedure(s) (LRB): TOTAL KNEE ARTHROPLASTY (Right)  ADDITIONAL DIAGNOSIS:  Active Problems:  * No active hospital problems. *   Acute Blood Loss Anemia   Plan: Physical Therapy as ordered Weight Bearing as Tolerated (WBAT)  DVT Prophylaxis:  Lovenox  DISCHARGE PLAN: Home  DISCHARGE NEEDS: HHPT, CPM, Walker and 3-in-1 comode seat         Blasa Raisch 02/19/2012, 12:17 PM

## 2012-02-19 NOTE — Progress Notes (Signed)
Physical Therapy Treatment Patient Details Name: Charles Allison MRN: [id number] DOB: [birth year] Today's Date: 02/19/2012 Time: 1350-1420 PT Time Calculation (min): 30 min  PT Assessment / Plan / Recommendation Comments on Treatment Session  Patient progressing much better this afternoon. He is more alert and was able to increase ambulation down the hall    Follow Up Recommendations  Home health PT;Supervision/Assistance - 24 hour     Does the patient have the potential to tolerate intense rehabilitation     Barriers to Discharge        Equipment Recommendations  Rolling walker with 5" wheels (3n1)    Recommendations for Other Services    Frequency 7X/week   Plan Discharge plan remains appropriate;Frequency remains appropriate    Precautions / Restrictions Precautions Precautions: Knee Precaution Booklet Issued: No Restrictions RLE Weight Bearing: Weight bearing as tolerated   Pertinent Vitals/Pain     Mobility  Bed Mobility Supine to Sit: 4: Min guard;HOB flat;With rails Sitting - Scoot to Edge of Bed: 4: Min guard Details for Bed Mobility Assistance: Cues for technique Transfers Sit to Stand: 4: Min assist;With upper extremity assist;From bed Stand to Sit: To bed;4: Min assist;With upper extremity assist;To chair/3-in-1 Details for Transfer Assistance: A to ensure stability and balance. Cues to control descent onto recliner and cues for safe hand placement Ambulation/Gait Ambulation/Gait Assistance: 4: Min assist Ambulation Distance (Feet): 60 Feet Assistive device: Rolling walker Ambulation/Gait Assistance Details: Cues for sequence, RW position. A for stability Gait Pattern: Step-to pattern;Decreased step length - right;Decreased step length - left;Antalgic Gait velocity: decreased    Exercises Total Joint Exercises Ankle Circles/Pumps: AROM;Both;20 reps Quad Sets: AAROM;Right;10 reps   PT Diagnosis:    PT Problem List:   PT Treatment Interventions:      PT Goals Acute Rehab PT Goals PT Goal: Supine/Side to Sit - Progress: Progressing toward goal PT Goal: Sit to Stand - Progress: Progressing toward goal PT Goal: Stand to Sit - Progress: Progressing toward goal PT Goal: Ambulate - Progress: Progressing toward goal PT Goal: Perform Home Exercise Program - Progress: Progressing toward goal  Visit Information  Last PT Received On: 02/19/12 Assistance Needed: +1    Subjective Data      Cognition  Overall Cognitive Status: Appears within functional limits for tasks assessed/performed Arousal/Alertness: Awake/alert Orientation Level: Appears intact for tasks assessed Behavior During Session: [facility] for tasks performed    Balance     End of Session PT - End of Session Equipment Utilized During Treatment: Gait belt Activity Tolerance: Patient tolerated treatment well Patient left: in chair;with call bell/phone within reach Nurse Communication: Mobility status   GP     Fredrich Birks 02/19/2012, 2:36 PM 02/19/2012 Fredrich Birks PTA [phone] pager [phone] office

## 2012-02-20 LAB — GLUCOSE, CAPILLARY

## 2012-02-20 LAB — BASIC METABOLIC PANEL
BUN: 25 mg/dL — ABNORMAL HIGH (ref 6–23)
CO2: 26 mEq/L (ref 19–32)
Calcium: 9.6 mg/dL (ref 8.4–10.5)
Creatinine, Ser: 1.04 mg/dL (ref 0.50–1.35)
Glucose, Bld: 144 mg/dL — ABNORMAL HIGH (ref 70–99)
Sodium: 139 mEq/L (ref 135–145)

## 2012-02-20 LAB — CBC
HCT: 30 % — ABNORMAL LOW (ref 39.0–52.0)
Hemoglobin: 10.1 g/dL — ABNORMAL LOW (ref 13.0–17.0)
MCH: 28.9 pg (ref 26.0–34.0)
MCV: 86 fL (ref 78.0–100.0)
RBC: 3.49 MIL/uL — ABNORMAL LOW (ref 4.22–5.81)

## 2012-02-20 MED ORDER — ENOXAPARIN SODIUM 40 MG/0.4ML ~~LOC~~ SOLN: 40.0000 mg | Freq: Every day | SUBCUTANEOUS | Status: DC

## 2012-02-20 MED ORDER — DIAZEPAM 5 MG PO TABS: 5.0000 mg | ORAL_TABLET | Freq: Every evening | ORAL | Status: DC | PRN

## 2012-02-20 MED ORDER — OXYCODONE HCL 5 MG PO TABS: 5.0000 mg | ORAL_TABLET | ORAL | Status: DC | PRN

## 2012-02-20 MED ORDER — METHOCARBAMOL 500 MG PO TABS: 500.0000 mg | ORAL_TABLET | Freq: Four times a day (QID) | ORAL | Status: DC | PRN

## 2012-02-20 MED ORDER — CELECOXIB 200 MG PO CAPS: 200.0000 mg | ORAL_CAPSULE | Freq: Two times a day (BID) | ORAL | Status: DC

## 2012-02-20 NOTE — Progress Notes (Signed)
Physical Therapy Treatment Patient Details Name: Charles Allison MRN: [id number] DOB: 05-Nov-1945 Today's Date: 02/20/2012 Time: 1315-1340 PT Time Calculation (min): 25 min  PT Assessment / Plan / Recommendation Comments on Treatment Session  Patient making great progress this afternoon. Patient to dicharge home with assistance from his sisters.     Follow Up Recommendations  Home health PT;Supervision/Assistance - 24 hour     Does the patient have the potential to tolerate intense rehabilitation     Barriers to Discharge        Equipment Recommendations  Rolling walker with 5" wheels;Other (comment) (3n1)    Recommendations for Other Services    Frequency 7X/week   Plan Discharge plan remains appropriate;Frequency remains appropriate    Precautions / Restrictions Restrictions RLE Weight Bearing: Weight bearing as tolerated   Pertinent Vitals/Pain     Mobility  Bed Mobility Supine to Sit: 5: Supervision Sitting - Scoot to Edge of Bed: 5: Supervision Transfers Sit to Stand: 5: Supervision;From bed Stand to Sit: 5: Supervision;To chair/3-in-1 Details for Transfer Assistance: Cues for safe hand placement Ambulation/Gait Ambulation/Gait Assistance: 5: Supervision Ambulation Distance (Feet): 250 Feet Assistive device: Rolling walker Ambulation/Gait Assistance Details: Patient workin to step through gait at good cadence Gait Pattern: Step-through pattern Stairs: Yes Stairs Assistance: 5: Supervision Stair Management Technique: Step to pattern;Sideways;One rail Left Number of Stairs: 4     Exercises Total Joint Exercises Quad Sets: AROM;Right;15 reps Heel Slides: Right;AAROM;15 reps Hip ABduction/ADduction: AAROM;Right;15 reps Straight Leg Raises: AAROM;Right;15 reps   PT Diagnosis:    PT Problem List:   PT Treatment Interventions:     PT Goals Acute Rehab PT Goals PT Goal: Supine/Side to Sit - Progress: Progressing toward goal PT Goal: Sit to Supine/Side -  Progress: Progressing toward goal PT Goal: Sit to Stand - Progress: Progressing toward goal PT Goal: Stand to Sit - Progress: Progressing toward goal PT Goal: Ambulate - Progress: Progressing toward goal PT Goal: Up/Down Stairs - Progress: Progressing toward goal PT Goal: Perform Home Exercise Program - Progress: Progressing toward goal  Visit Information  Last PT Received On: 02/20/12 Assistance Needed: +1    Subjective Data      Cognition  Overall Cognitive Status: Appears within functional limits for tasks assessed/performed Arousal/Alertness: Awake/alert Orientation Level: Appears intact for tasks assessed Behavior During Session: [facility] for tasks performed    Balance     End of Session PT - End of Session Equipment Utilized During Treatment: Gait belt Activity Tolerance: Patient tolerated treatment well Patient left: in chair;with call bell/phone within reach Nurse Communication: Mobility status   GP     Fredrich Birks 02/20/2012, 1:53 PM 02/20/2012 Fredrich Birks PTA [phone] pager [phone] office

## 2012-02-20 NOTE — Discharge Summary (Signed)
Charles Spurling, MD   Charles Cabal, PA-C [address] [place], [place], [place]  [address]                             [phone]  PATIENT ID: Charles Allison        MRN:  [id number]          DOB/AGE: [birth year] / 65 y.o.    DISCHARGE SUMMARY  ADMISSION DATE:    02/17/2012 DISCHARGE DATE:   02/20/2012   ADMISSION DIAGNOSIS: osteoarthritis right knee    DISCHARGE DIAGNOSIS:  osteoarthritis right knee    ADDITIONAL DIAGNOSIS: Active Problems:  * No active hospital problems. *   Past Medical History  Diagnosis Date  . Diabetes mellitus without complication   . [facility]  . Sleep apnea     bi pap    > 5 yrs  last sleep study  . Arthritis   . H/O hiatal hernia   . Hx of cardiomegaly   . Depression     PROCEDURE: Procedure(s): TOTAL KNEE ARTHROPLASTY on 02/17/2012  CONSULTS:     HISTORY:  See H&P in chart  HOSPITAL COURSE:  Charles Allison is a 66 y.o. admitted on 02/17/2012 and found to have a diagnosis of osteoarthritis right knee.  After appropriate laboratory studies were obtained  they were taken to the operating room on 02/17/2012 and underwent Procedure(s): TOTAL KNEE ARTHROPLASTY.   They were given perioperative antibiotics:  Anti-infectives     Start     Dose/Rate Route Frequency Ordered Stop   02/17/12 1700   ceFAZolin (ANCEF) IVPB 2 g/50 mL premix        2 g 100 mL/hr over 30 Minutes Intravenous Every 6 hours 02/17/12 1541 02/18/12 0343   02/16/12 1404   ceFAZolin (ANCEF) 3 g in dextrose 5 % 50 mL IVPB        3 g 160 mL/hr over 30 Minutes Intravenous 60 min pre-op 02/16/12 1404 02/17/12 0838        .  Tolerated the procedure well.  Placed with a foley intraoperatively.  Given Ofirmev at induction and for 48 hours.    POD #1, allowed out of bed to a chair.  PT for ambulation and exercise program.  Foley D/C'd in morning.  IV saline locked.  O2 discontionued.  POD #2, continued PT and ambulation.   Hemovac  pulled. .  The remainder of the hospital course was dedicated to ambulation and strengthening.   The patient was discharged on 3 Days Post-Op in  Good condition.  Blood products given:none  DIAGNOSTIC STUDIES: Recent vital signs: Patient Vitals for the past 24 hrs:  BP Temp Temp src Pulse Resp SpO2  02/20/12 1036 140/76 mmHg - - 72  - -  02/20/12 0514 155/82 mmHg 98.3 F (36.8 C) - 66  16  98 %  02/20/12 0400 - - - - 17  99 %  02/20/12 0000 - - - - 16  98 %  28-Feb-2012 2129 138/58 mmHg 99.9 F (37.7 C) Oral 81  20  94 %  February 28, 2012 2000 - - - - 20  94 %  02-28-2012 1600 - - - - 18  -  2012/02/28 1421 125/55 mmHg 98.3 F (36.8 C) Oral 65  18  96 %       Recent laboratory studies:  Basename 02/20/12 0500 2012/02/28 0645  02/18/12 0600 02/14/12 1233  WBC 6.2 6.5 8.2 6.1  HGB 10.1* 10.3* 11.5* 13.9  HCT 30.0* 31.2* 35.0* 41.9  PLT 133* 125* 153 161    Basename 02/20/12 0500 02/19/12 0645 02/18/12 0600 02/14/12 1233  NA 139 133* 137 140  K 4.3 3.8 4.3 4.9  CL 104 98 100 103  CO2 26 25 27 28   BUN 25* 28* 16 21  CREATININE 1.04 1.49* 1.02 1.05  GLUCOSE 144* 166* 169* 123*  CALCIUM 9.6 8.8 8.9 10.1   Lab Results  Component Value Date   INR 1.04 02/14/2012   INR 1.0 05/14/2007     Recent Radiographic Studies :  Dg Chest 2 View  02/14/2012  *RADIOLOGY REPORT*  Clinical Data: Preop for hip arthroplasty  CHEST - 2 VIEW  Comparison: 05/14/2007  Findings: Cardiomediastinal silhouette is stable.  No acute infiltrate or pleural effusion.  No pulmonary edema.  Left shoulder prosthesis again noted. Bony thorax is stable.  IMPRESSION: No active disease.  No significant change.   Original Report Authenticated By: Natasha Mead, M.D.     DISCHARGE INSTRUCTIONS: Discharge Orders    Future Orders Please Complete By Expires   Diet - low sodium heart healthy      Call MD / Call 911      Comments:   If you experience chest pain or shortness of breath, CALL 911 and be transported to the hospital  emergency room.  If you develope a fever above 101 F, pus (white drainage) or increased drainage or redness at the wound, or calf pain, call your surgeon's office.   Constipation Prevention      Comments:   Drink plenty of fluids.  Prune juice may be helpful.  You may use a stool softener, such as Colace (over the counter) 100 mg twice a day.  Use MiraLax (over the counter) for constipation as needed.   Increase activity slowly as tolerated      Driving restrictions      Comments:   No driving for 6 weeks   Lifting restrictions      Comments:   No lifting for 6 weeks   CPM      Comments:   Continuous passive motion machine (CPM):      Use the CPM from 0 to 90 for 6-8 hours per day.      You may increase by 10 per day.  You may break it up into 2 or 3 sessions per day.      Use CPM for 2 weeks or until you are told to stop.   TED hose      Comments:   Use stockings (TED hose) for 3 weeks on both leg(s).  You may remove them at night for sleeping.   Change dressing      Comments:   Change dressing on friday, then change the dressing daily with sterile 4 x 4 inch gauze dressing and apply TED hose.  You may clean the incision with alcohol prior to redressing.   Do not put a pillow under the knee. Place it under the heel.         DISCHARGE MEDICATIONS:     Medication List     As of 02/20/2012 12:21 PM    STOP taking these medications         aspirin EC 81 MG tablet      TAKE these medications         celecoxib 200 MG capsule  Commonly known as: CELEBREX   Take 1 capsule (200 mg total) by mouth every 12 (twelve) hours.      cholecalciferol 1000 UNITS tablet   Commonly known as: VITAMIN D   Take 1,000 Units by mouth daily.      diazepam 5 MG tablet   Commonly known as: VALIUM   Take 1-2 tablets (5-10 mg total) by mouth at bedtime as needed for sleep.      doxazosin 8 MG tablet   Commonly known as: CARDURA   Take 8 mg by mouth at bedtime.      enoxaparin 40 MG/0.4ML  injection   Commonly known as: LOVENOX   Inject 0.4 mLs (40 mg total) into the skin daily.      FLUoxetine 20 MG capsule   Commonly known as: PROZAC   Take 60 mg by mouth daily.      glipiZIDE 10 MG tablet   Commonly known as: GLUCOTROL   Take 10-20 mg by mouth 2 (two) times daily before a meal. 2 tabs in the am, 1 tab in the evening      lisinopril 5 MG tablet   Commonly known as: PRINIVIL,ZESTRIL   Take 5 mg by mouth daily.      metFORMIN 1000 MG tablet   Commonly known as: GLUCOPHAGE   Take 1,000 mg by mouth 2 (two) times daily with a meal.      methocarbamol 500 MG tablet   Commonly known as: ROBAXIN   Take 1 tablet (500 mg total) by mouth every 6 (six) hours as needed.      metoprolol 50 MG tablet   Commonly known as: LOPRESSOR   Take 25 mg by mouth 2 (two) times daily.      omeprazole 20 MG capsule   Commonly known as: PRILOSEC   Take 20 mg by mouth daily.      oxyCODONE 5 MG immediate release tablet   Commonly known as: Oxy IR/ROXICODONE   Take 1-2 tablets (5-10 mg total) by mouth every 4 (four) hours as needed for pain.      pioglitazone 45 MG tablet   Commonly known as: ACTOS   Take 45 mg by mouth daily.      pravastatin 20 MG tablet   Commonly known as: PRAVACHOL   Take 40 mg by mouth at bedtime.      ranitidine 300 MG tablet   Commonly known as: ZANTAC   Take 300 mg by mouth at bedtime.        FOLLOW UP VISIT:       Follow-up Information    Follow up with Raymon Mutton, MD. Call on 03/02/2012.   Contact information:   [address] [place] [address] [phone]          DISPOSITION:  home    CONDITION:  Good  Meghana Tullo 02/20/2012, 12:21 PM

## 2012-02-20 NOTE — Progress Notes (Signed)
SPORTS MEDICINE AND JOINT REPLACEMENT  Georgena Spurling, MD   Altamese Cabal, PA-C [address] [place], [place], [place]  [address]                             [phone]   PROGRESS NOTE  Subjective:  negative for Chest Pain  negative for Shortness of Breath  negative for Nausea/Vomiting   negative for Calf Pain  negative for Bowel Movement   Tolerating Diet: yes         Patient reports pain as 4 on 0-10 scale.    Objective: Vital signs in last 24 hours:   Patient Vitals for the past 24 hrs:  BP Temp Temp src Pulse Resp SpO2  02/20/12 1036 140/76 mmHg - - 72  - -  02/20/12 0514 155/82 mmHg 98.3 F (36.8 C) - 66  16  98 %  02/20/12 0400 - - - - 17  99 %  02/20/12 0000 - - - - 16  98 %  02/19/12 2129 138/58 mmHg 99.9 F (37.7 C) Oral 81  20  94 %  02/19/12 2000 - - - - 20  94 %  02/19/12 1600 - - - - 18  -  02/19/12 1421 125/55 mmHg 98.3 F (36.8 C) Oral 65  18  96 %    @flow {1959:LAST@   Intake/Output from previous day:   12/18 0701 - 12/19 0700 In: 1440 [P.O.:1440] Out: 2300 [Urine:2300]   Intake/Output this shift:       Intake/Output      12/18 0701 - 12/19 0700 12/19 0701 - 12/20 0700   P.O. 1440    Total Intake 1440    Urine 2300    Total Output 2300    Net -860         Urine Occurrence 1 x       LABORATORY DATA:  Basename 02/20/12 0500 02/19/12 0645 02/18/12 0600 02/14/12 1233  WBC 6.2 6.5 8.2 6.1  HGB 10.1* 10.3* 11.5* 13.9  HCT 30.0* 31.2* 35.0* 41.9  PLT 133* 125* 153 161    Basename 02/20/12 0500 02/19/12 0645 02/18/12 0600 02/14/12 1233  NA 139 133* 137 140  K 4.3 3.8 4.3 4.9  CL 104 98 100 103  CO2 26 25 27 28   BUN 25* 28* 16 21  CREATININE 1.04 1.49* 1.02 1.05  GLUCOSE 144* 166* 169* 123*  CALCIUM 9.6 8.8 8.9 10.1   Lab Results  Component Value Date   INR 1.04 02/14/2012   INR 1.0 05/14/2007    Examination:  General appearance: alert, cooperative and no distress Extremities: Homans sign is negative, no sign of DVT  Wound  Exam: clean, dry, intact   Drainage:  None: wound tissue dry  Motor Exam: EHL and FHL Intact  Sensory Exam: Deep Peroneal normal  Vascular Exam:    Assessment:    3 Days Post-Op  Procedure(s) (LRB): TOTAL KNEE ARTHROPLASTY (Right)  ADDITIONAL DIAGNOSIS:  Active Problems:  * No active hospital problems. *   Acute Blood Loss Anemia   Plan: Physical Therapy as ordered Weight Bearing as Tolerated (WBAT)  DVT Prophylaxis:  Lovenox  DISCHARGE PLAN: Home  DISCHARGE NEEDS: HHPT, CPM, Walker and 3-in-1 comode seat         Chealsey Miyamoto 02/20/2012, 12:13 PM

## 2012-02-20 NOTE — Progress Notes (Signed)
Occupational Therapy Treatment Patient Details Name: Charles Allison MRN: [id number] DOB: 06/06/1945 Today's Date: 02/20/2012 Time:  -     OT Assessment / Plan / Recommendation Comments on Treatment Session Pt is anxious to leave [facility] today and asking when MD will round to give discharge orders. pt is progressing well however impulsive. Pt requires reinforcement of instructions due to independence attempting PTA routine.    Follow Up Recommendations  Home health OT    Barriers to Discharge       Equipment Recommendations  3 in 1 bedside comode;Wheelchair (measurements OT);Wheelchair cushion (measurements OT)    Recommendations for Other Services    Frequency Min 2X/week   Plan Discharge plan remains appropriate    Precautions / Restrictions Precautions Precautions: Knee Restrictions RLE Weight Bearing: Weight bearing as tolerated   Pertinent Vitals/Pain No pain reported only fatigue    ADL  Eating/Feeding: Independent Where Assessed - Eating/Feeding: Chair Grooming: Wash/dry hands;Wash/dry face;Teeth care;Supervision/safety Where Assessed - Grooming: Unsupported standing Toilet Transfer: Minimal assistance Toilet Transfer Method: Sit to stand Toilet Transfer Equipment: Raised toilet seat with arms (or 3-in-1 over toilet) Toileting - Clothing Manipulation and Hygiene: Minimal assistance Where Assessed - Toileting Clothing Manipulation and Hygiene: Sit to stand from 3-in-1 or toilet Tub/Shower Transfer: Moderate assistance Tub/Shower Transfer Method: Science writer:  (tub shower with 3n1) Equipment Used: Gait belt;Rolling walker Transfers/Ambulation Related to ADLs: pt required x2 rest breaks with ambulation back to room. pt demonstrates fatigue with activity. ADL Comments: pt demonstrates impulsive behavior during tub transfer. pt picking 3n1 up standing in an unsafe manner to adjust chair position. pt completed tub transfer on 3n1 and then tells  therapist that mothers house has walk in shower and tub with door. Pt educated with demonstration of walk in shower transfer. Pt says "no i can go in the front and then sit on that seat" Pt advised to transfer with strong LE followed by operated entering posteriorly and attempt first transfer with home health OT. Pt agreeable to sponge bath until therapist can assist at mothers house.    OT Diagnosis:    OT Problem List:   OT Treatment Interventions:     OT Goals Acute Rehab OT Goals Time For Goal Achievement: 03/03/12 Potential to Achieve Goals: Good ADL Goals Pt Will Transfer to Toilet: 3-in-1;Stand pivot transfer;Extra wide 3-in-1;with supervision ADL Goal: Toilet Transfer - Progress: Met Miscellaneous OT Goals Miscellaneous OT Goal #1: pt will complete bed mobility Min (A) as precursor for adls  Visit Information  Last OT Received On: 02/20/12 Assistance Needed: +1    Subjective Data      Prior Functioning       Cognition  Overall Cognitive Status: Appears within functional limits for tasks assessed/performed Arousal/Alertness: Awake/alert Orientation Level: Appears intact for tasks assessed Behavior During Session: [facility] for tasks performed Safety/Judgement: Impulsive    Mobility  Shoulder Instructions Bed Mobility Bed Mobility: Not assessed Transfers Sit to Stand: With upper extremity assist;From chair/3-in-1;4: Min guard Stand to Sit: 4: Min guard;With upper extremity assist;To chair/3-in-1 Details for Transfer Assistance: pt with min v/c to control descend to chair       Exercises      Balance     End of Session OT - End of Session Activity Tolerance: Patient tolerated treatment well Patient left: in chair;with call bell/phone within reach Nurse Communication: Mobility status;Precautions  GO     Lucile Shutters 02/20/2012, 9:53 AM Pager: [phone]

## 2012-02-20 NOTE — Progress Notes (Signed)
Physical Therapy Treatment Patient Details Name: Charles Allison MRN: [id number] DOB: 1945/12/27 Today's Date: 02/20/2012 Time: 0827-0900 PT Time Calculation (min): 33 min  PT Assessment / Plan / Recommendation Comments on Treatment Session  Patient more alert today and aware of precautions and safety with mobility. Progressing well. Hopeful for discharge home after second session of therapy this afternoon    Follow Up Recommendations  Home health PT;Supervision/Assistance - 24 hour     Does the patient have the potential to tolerate intense rehabilitation     Barriers to Discharge        Equipment Recommendations  Rolling walker with 5" wheels;Other (comment) (3n1)    Recommendations for Other Services    Frequency 7X/week   Plan Discharge plan remains appropriate;Frequency remains appropriate    Precautions / Restrictions Precautions Precautions: Knee Restrictions Weight Bearing Restrictions: Yes RLE Weight Bearing: Weight bearing as tolerated   Pertinent Vitals/Pain     Mobility  Bed Mobility Bed Mobility: Not assessed Supine to Sit: 5: Supervision Sitting - Scoot to Edge of Bed: 5: Supervision Transfers Sit to Stand: 4: Min guard;With upper extremity assist;From chair/3-in-1 Stand to Sit: 4: Min guard;With upper extremity assist;To toilet Details for Transfer Assistance: Cues for safe hand placement Ambulation/Gait Ambulation/Gait Assistance: 4: Min guard Ambulation Distance (Feet): 120 Feet Assistive device: Rolling walker Ambulation/Gait Assistance Details: Cues for posture and management of RW Gait Pattern: Step-through pattern;Decreased stride length Stairs: Yes Stairs Assistance: 4: Min guard Stair Management Technique: Sideways;Step to pattern;One rail Right Number of Stairs: 4     Exercises     PT Diagnosis:    PT Problem List:   PT Treatment Interventions:     PT Goals Acute Rehab PT Goals PT Goal: Supine/Side to Sit - Progress: Progressing  toward goal PT Goal: Sit to Stand - Progress: Progressing toward goal PT Goal: Stand to Sit - Progress: Progressing toward goal PT Goal: Ambulate - Progress: Progressing toward goal PT Goal: Up/Down Stairs - Progress: Progressing toward goal  Visit Information  Last PT Received On: 02/20/12 Assistance Needed: +1    Subjective Data      Cognition  Overall Cognitive Status: Appears within functional limits for tasks assessed/performed Arousal/Alertness: Awake/alert Orientation Level: Appears intact for tasks assessed Behavior During Session: [facility] for tasks performed Safety/Judgement: Impulsive    Balance     End of Session PT - End of Session Equipment Utilized During Treatment: Gait belt Activity Tolerance: Patient tolerated treatment well Patient left: in chair;with call bell/phone within reach Nurse Communication: Mobility status   GP     Fredrich Birks 02/20/2012, 11:52 AM 02/20/2012 Fredrich Birks PTA [phone] pager [phone] office

## 2013-02-18 ENCOUNTER — Ambulatory Visit: Payer: Self-pay | Admitting: Physician Assistant

## 2013-02-18 LAB — CREATININE, SERUM
EGFR (African American): 60 — ABNORMAL LOW
EGFR (Non-African Amer.): 51 — ABNORMAL LOW

## 2013-04-19 ENCOUNTER — Emergency Department: Payer: Self-pay | Admitting: Emergency Medicine

## 2013-04-19 LAB — COMPREHENSIVE METABOLIC PANEL
ALBUMIN: 3.1 g/dL — AB (ref 3.4–5.0)
ALK PHOS: 69 U/L
ALT: 69 U/L (ref 12–78)
AST: 61 U/L — AB (ref 15–37)
Anion Gap: 8 (ref 7–16)
BUN: 20 mg/dL — ABNORMAL HIGH (ref 7–18)
Bilirubin,Total: 1.1 mg/dL — ABNORMAL HIGH (ref 0.2–1.0)
CALCIUM: 9.5 mg/dL (ref 8.5–10.1)
CHLORIDE: 102 mmol/L (ref 98–107)
CO2: 28 mmol/L (ref 21–32)
Creatinine: 1.37 mg/dL — ABNORMAL HIGH (ref 0.60–1.30)
EGFR (African American): >60
EGFR (Non-African Amer.): 53 — ABNORMAL LOW
Glucose: 215 mg/dL — ABNORMAL HIGH (ref 65–99)
OSMOLALITY: 285 (ref 275–301)
Potassium: 3.7 mmol/L (ref 3.5–5.1)
Sodium: 138 mmol/L (ref 136–145)
Total Protein: 7.3 g/dL (ref 6.4–8.2)

## 2013-04-19 LAB — URINALYSIS, COMPLETE
BACTERIA: NONE SEEN
BILIRUBIN, UR: NEGATIVE
Glucose,UR: 50 mg/dL (ref 0–75)
Leukocyte Esterase: NEGATIVE
NITRITE: NEGATIVE
PH: 5 (ref 4.5–8.0)
PROTEIN: NEGATIVE
SPECIFIC GRAVITY: 1.013 (ref 1.003–1.030)
SQUAMOUS EPITHELIAL: NONE SEEN
WBC UR: 1 /HPF (ref 0–5)

## 2013-04-19 LAB — CBC
HCT: 36.1 % — ABNORMAL LOW (ref 40.0–52.0)
HGB: 12.4 g/dL — ABNORMAL LOW (ref 13.0–18.0)
MCH: 29.9 pg (ref 26.0–34.0)
MCHC: 34.4 g/dL (ref 32.0–36.0)
MCV: 87 fL (ref 80–100)
Platelet: 186 10*3/uL (ref 150–440)
RBC: 4.15 10*6/uL — ABNORMAL LOW (ref 4.40–5.90)
RDW: 14.3 % (ref 11.5–14.5)
WBC: 9.3 10*3/uL (ref 3.8–10.6)

## 2013-04-19 LAB — LIPASE, BLOOD: LIPASE: 1063 U/L — AB (ref 73–393)

## 2016-01-10 ENCOUNTER — Other Ambulatory Visit: Payer: Self-pay | Admitting: Orthopedic Surgery

## 2016-01-10 DIAGNOSIS — M19011 Primary osteoarthritis, right shoulder: Secondary | ICD-10-CM

## 2016-01-24 ENCOUNTER — Ambulatory Visit
Admission: RE | Admit: 2016-01-24 | Discharge: 2016-01-24 | Disposition: A | Discharge status: Home / Self Care | Payer: Non-veteran care | Source: Ambulatory Visit | Attending: Orthopedic Surgery | Admitting: Orthopedic Surgery

## 2016-01-24 DIAGNOSIS — X58XXXA Exposure to other specified factors, initial encounter: Secondary | ICD-10-CM | POA: Diagnosis not present

## 2016-01-24 DIAGNOSIS — S46011A Strain of muscle(s) and tendon(s) of the rotator cuff of right shoulder, initial encounter: Secondary | ICD-10-CM | POA: Insufficient documentation

## 2016-01-24 DIAGNOSIS — M19011 Primary osteoarthritis, right shoulder: Secondary | ICD-10-CM

## 2016-03-07 NOTE — H&P (Signed)
Charles Allison is an 71 y.o. male.    Chief Complaint: right shoulder pain  HPI: Pt is a 71 y.o. male complaining of right shoulder pain for multiple years. Pain had continually increased since the beginning. X-rays in the clinic show end-stage arthritic changes of the right shoulder. Pt has tried various conservative treatments which have failed to alleviate their symptoms, including injections and therapy. Various options are discussed with the patient. Risks, benefits and expectations were discussed with the patient. Patient understand the risks, benefits and expectations and wishes to proceed with surgery.   PCP:  Feliciana RossettiGRISSO,GREG, MD  D/C Plans: Home  PMH: Past Medical History:  Diagnosis Date  . Arthritis   . Depression   . Diabetes mellitus without complication   . H/O hiatal hernia   . Hx of cardiomegaly   . [facility]  . Sleep apnea    bi pap    > 5 yrs  last sleep study    PSH: Past Surgical History:  Procedure Laterality Date  . carpel tu     bil  . HERNIA REPAIR    . JOINT REPLACEMENT     lt shoulder, lt knee  . SHOULDER SURGERY     LEFT  . TOTAL KNEE ARTHROPLASTY  02/17/2012   Procedure: TOTAL KNEE ARTHROPLASTY;  Surgeon: Dannielle HuhSteve Lucey, MD;  Location: [facility] OR;  Service: Orthopedics;  Laterality: Right;  . TOTAL KNEE ARTHROPLASTY  02/17/2012   RIGHT     Social History:  reports that he quit smoking about 16 years ago. He has never used smokeless tobacco. He reports that he drinks alcohol. He reports that he does not use drugs.  Allergies:  No Known Allergies  Medications: No current facility-administered medications for this encounter.    Current Outpatient Prescriptions  Medication Sig Dispense Refill  . celecoxib (CELEBREX) 200 MG capsule Take 1 capsule (200 mg total) by mouth every 12 (twelve) hours. 30 capsule 0  . cholecalciferol (VITAMIN D) 1000 UNITS tablet Take 1,000 Units by mouth daily.    . diazepam (VALIUM) 5 MG tablet  Take 1-2 tablets (5-10 mg total) by mouth at bedtime as needed for sleep. 30 tablet 0  . doxazosin (CARDURA) 8 MG tablet Take 8 mg by mouth at bedtime.    . enoxaparin (LOVENOX) 40 MG/0.4ML injection Inject 0.4 mLs (40 mg total) into the skin daily. 11 Syringe 0  . FLUoxetine (PROZAC) 20 MG capsule Take 60 mg by mouth daily.    Marland Kitchen. glipiZIDE (GLUCOTROL) 10 MG tablet Take 10-20 mg by mouth 2 (two) times daily before a meal. 2 tabs in the am, 1 tab in the evening    . lisinopril (PRINIVIL,ZESTRIL) 5 MG tablet Take 5 mg by mouth daily.    . metFORMIN (GLUCOPHAGE) 1000 MG tablet Take 1,000 mg by mouth 2 (two) times daily with a meal.    . methocarbamol (ROBAXIN) 500 MG tablet Take 1 tablet (500 mg total) by mouth every 6 (six) hours as needed. 60 tablet 0  . metoprolol (LOPRESSOR) 50 MG tablet Take 25 mg by mouth 2 (two) times daily.    Marland Kitchen. omeprazole (PRILOSEC) 20 MG capsule Take 20 mg by mouth daily.    Marland Kitchen. oxyCODONE (OXY IR/ROXICODONE) 5 MG immediate release tablet Take 1-2 tablets (5-10 mg total) by mouth every 4 (four) hours as needed for pain. 90 tablet 0  . pioglitazone (ACTOS) 45 MG tablet Take 45 mg by mouth  daily.    . pravastatin (PRAVACHOL) 20 MG tablet Take 40 mg by mouth at bedtime.    . ranitidine (ZANTAC) 300 MG tablet Take 300 mg by mouth at bedtime.      No results found for this or any previous visit (from the past 48 hour(s)). No results found.  ROS: Pain with rom of the right upper extremity  Physical Exam:  Alert and oriented 71 y.o. male in no acute distress Cranial nerves 2-12 intact Cervical spine: full rom with no tenderness, nv intact distally Chest: active breath sounds bilaterally, no wheeze rhonchi or rales Heart: regular rate and rhythm, no murmur Abd: non tender non distended with active bowel sounds Hip is stable with rom  Right shoulder with limited rom and strength nv intact distally No rashes or edema  Assessment/Plan Assessment: right shoulder cuff  arthropathy  Plan: Patient will undergo a right reverse total shoulder by Dr. Ranell Patrick at [facility]. Risks benefits and expectations were discussed with the patient. Patient understand risks, benefits and expectations and wishes to proceed.

## 2016-03-18 ENCOUNTER — Encounter (HOSPITAL_COMMUNITY)
Admission: RE | Admit: 2016-03-18 | Discharge: 2016-03-18 | Disposition: A | Discharge status: Home / Self Care | Payer: Non-veteran care | Source: Ambulatory Visit | Attending: Orthopedic Surgery | Admitting: Orthopedic Surgery

## 2016-03-18 ENCOUNTER — Encounter (HOSPITAL_COMMUNITY): Payer: Self-pay

## 2016-03-18 DIAGNOSIS — Z01812 Encounter for preprocedural laboratory examination: Secondary | ICD-10-CM | POA: Insufficient documentation

## 2016-03-18 DIAGNOSIS — Z01810 Encounter for preprocedural cardiovascular examination: Secondary | ICD-10-CM | POA: Insufficient documentation

## 2016-03-18 DIAGNOSIS — R9431 Abnormal electrocardiogram [ECG] [EKG]: Secondary | ICD-10-CM | POA: Insufficient documentation

## 2016-03-18 HISTORY — DX: Dyspnea, unspecified: R06.00

## 2016-03-18 HISTORY — DX: Hypothyroidism, unspecified: E03.9

## 2016-03-18 HISTORY — DX: Benign prostatic hyperplasia without lower urinary tract symptoms: N40.0

## 2016-03-18 LAB — GLUCOSE, CAPILLARY: Glucose-Capillary: 176 mg/dL — ABNORMAL HIGH (ref 65–99)

## 2016-03-18 LAB — BASIC METABOLIC PANEL
Anion gap: 10 (ref 5–15)
BUN: 16 mg/dL (ref 6–20)
CHLORIDE: 104 mmol/L (ref 101–111)
CO2: 26 mmol/L (ref 22–32)
Calcium: 10.1 mg/dL (ref 8.9–10.3)
Creatinine, Ser: 1.29 mg/dL — ABNORMAL HIGH (ref 0.61–1.24)
GFR calc non Af Amer: 55 mL/min — ABNORMAL LOW (ref >60)
Glucose, Bld: 176 mg/dL — ABNORMAL HIGH (ref 65–99)
POTASSIUM: 5 mmol/L (ref 3.5–5.1)
SODIUM: 140 mmol/L (ref 135–145)

## 2016-03-18 LAB — SURGICAL PCR SCREEN
MRSA, PCR: NEGATIVE
STAPHYLOCOCCUS AUREUS: NEGATIVE

## 2016-03-18 LAB — CBC
HCT: 43.5 % (ref 39.0–52.0)
HEMOGLOBIN: 14.2 g/dL (ref 13.0–17.0)
MCH: 28.6 pg (ref 26.0–34.0)
MCHC: 32.6 g/dL (ref 30.0–36.0)
MCV: 87.5 fL (ref 78.0–100.0)
Platelets: 165 10*3/uL (ref 150–400)
RBC: 4.97 MIL/uL (ref 4.22–5.81)
RDW: 14.2 % (ref 11.5–15.5)
WBC: 7 10*3/uL (ref 4.0–10.5)

## 2016-03-18 NOTE — Pre-Procedure Instructions (Signed)
    Jennefer Bravolton R Province  03/18/2016      [facility] [address] - [place], [place] - [id number] [place] [id number] Phone: [phone] Fax: [phone]    Your procedure is scheduled on 03/20/16.  [facility].M.  Call this number if you have problems the morning of surgery:  [phone]   Remember:  Do not eat food or drink liquids after midnight.  Take these medicines the morning of surgery with A SIP OF WATER ---aricept,prozac,synthroid,metroprolol,flomax   Do not wear jewelry, make-up or nail polish.  Do not wear lotions, powders, or perfumes, or deoderant.  Do not shave 48 hours prior to surgery.  Men may shave face and neck.  Do not bring valuables to the hospital.  [facility] any belongings or valuables.  Contacts, dentures or bridgework may not be worn into surgery.  Leave your suitcase in the car.  After surgery it may be brought to your room.  For patients admitted to the hospital, discharge time will be determined by your treatment team.  Patients discharged the day of surgery will not be allowed to drive home.   Name and phone number of your driver:    Special instructions:  Do not take any aspirin,anti-inflammatories,vitamins,or herbal supplements 5-7 days prior to surgery.  Please read over the following fact sheets that you were given. MRSA Information

## 2016-03-19 MED ORDER — DEXTROSE 5 % IV SOLN
3.0000 g | INTRAVENOUS | Status: DC
  Filled 2016-03-19: qty 3000

## 2016-03-19 NOTE — Progress Notes (Addendum)
Anesthesia chart review: Patient is a 71 year old male scheduled for right reverse total shoulder arthroplasty on 03/20/2016 by Dr. Ranell PatrickNorris.  History includes former smoker (quit '01), DM2, HTN, cardiomegaly, OSA (BiPap), arthritis, depression, BPH, hiatal hernia, hypothyroidism, dyspnea, left total shoulder '03, right TKA '13, left TKA '09, hernia repair, partial nephrectomy. BMI is consistent with obesity.  PCP is Dr. Hulen SkainsAnyim (I believe it is Dr. Margot AblesAhunna Okwubunka-Anyim, main [facility] [place] # [phone]) with the [facility]. Locally, he is also seen by Dr. Feliciana RossettiGreg Grisso with [facility] in [place] (see Care Everywhere), last visit 02/14/16. Apparently it was a complicated visit. Patient needed a Medicare annual wellness visit, but part way through the visit also inquired about surgical clearance, and reported unknown labs and thyroid follow-up at [facility] for malaise and fatigue. He denied chest pain and SOB. There was also confusion about his medication list. Dr. Shary DecampGrisso would not clear at that time until labs reviewed and he got a copy of the reported stress test patient had [facility] "about 5 years [facility]." According to 02/22/16 notation in Care Everywhere, patient did get Dr. Shary DecampGrisso a copy of his stress test which was "equivocal." Dr. Shary DecampGrisso would not clear patient without specific pre-operative appointment and that patient may need a new stress test. Patient wanted Dr. Shary DecampGrisso to order the stress test at [facility], but Dr. Shary DecampGrisso could not order [facility] testing as he was not a [facility] physician. Dr. Shary DecampGrisso was willing to fax his note to the [facility], so patient could follow-up with the [facility] for additional recommendations. I do not currently have any [facility] notes (requested), but patient did get a medical clearance [facility] Dr. Hulen SkainsAnyim.  Meds include aspirin 81 mg, Klonopin, Aricept, flaxseed oil, Prozac, glipizide, levothyroxine, losartan, metformin, Lopressor, Prilosec, Actos, pravastatin,  Zantac, Flomax, trazodone.  BP (!) 151/66   Pulse 62   Temp 36.5 C (Oral)   Resp 20   Ht 5' 10.5" (1.791 m)   Wt 274 lb 7.6 oz (124.5 kg)   SpO2 97%   BMI 38.83 kg/m   EKG 03/18/16: SB at 58 bpm, minimal voltage criteria for LVH, may be normal variant, nonspecific T wave abnormality.  Preoperative labs noted. Cr 1.29, CBC WNL. Glucose 176. A1c 7.5 on 02/14/16 (Care Everywhere).  Reviewed above with anesthesiologist Dr. Aleene DavidsonE. Fitzgerald. It appears patient had recent visits with both his primary care providers ([facility] and also at [facility]). He initially was going to get surgical clearance from Dr. Shary DecampGrisso but would need a designated pre-operative appointment with potential for additional testing. Patient ultimately got medical clearance from this [facility] provider instead. [facility] records have been requested, but are still pending. Since records are pending, Dr. Sampson GoonFitzgerald wanted to at least attempt to get a hold of Dr. Kathlee Nationskwubunka-Anyim's office to clarify clearance and that no further preoperative testing planned. If unable to get in touch with her, then he felt patient would be evaluated by his anesthesiologist tomorrow. If any concerns following evaluation then anesthesiologist could have staff re-attempt to reach Dr. Fredric Dinekwubunka-Anyim or discuss further with Dr. Ranell PatrickNorris. (I did call the [facility] this afternoon to speak with Dr. Fredric Dinekwubunka-Anyim, but was told her office was currently closed. They were given my office number if someone is able to contact me at a later time by tomorrow. I'll plan to update my note if I hear back from the [facility].)  Shonna ChockAllison Eduardo Wurth, PA-C [facility]/Anesthesiology Phone [phone] 03/19/2016 4:17 PM  Addendum: Patient has already had an anesthesiologist evaluation and is currently in surgery. I just received a phone call back from Dr. Kathlee Nations nurse. Dr. Hulen Skains had given clearance after making sure Dr. Ranell Patrick was okay  with patient's A1c of 7.5. She had not planned on ordering a stress test, but was willing to do so if Dr. Shary Decamp recommended; however patient did not have a preoperative stress test because there was no formal request by Dr. Shary Decamp since he never actually saw Mr. Raptis for a preoperative evaluation. Dr. Hulen Skains office number is [phone], ext [phone].  Velna Ochs [facility] Short Stay Center/Anesthesiology Phone [phone] 03/20/2016 12:31 PM

## 2016-03-20 ENCOUNTER — Encounter (HOSPITAL_COMMUNITY)
Admission: RE | Disposition: A | Discharge status: Home / Self Care | Payer: Self-pay | Source: Ambulatory Visit | Attending: Orthopedic Surgery

## 2016-03-20 ENCOUNTER — Inpatient Hospital Stay (HOSPITAL_COMMUNITY): Payer: Non-veteran care | Admitting: Certified Registered Nurse Anesthetist

## 2016-03-20 ENCOUNTER — Inpatient Hospital Stay (HOSPITAL_COMMUNITY): Payer: Non-veteran care

## 2016-03-20 ENCOUNTER — Encounter (HOSPITAL_COMMUNITY): Payer: Self-pay | Admitting: Certified Registered Nurse Anesthetist

## 2016-03-20 ENCOUNTER — Inpatient Hospital Stay (HOSPITAL_COMMUNITY): Payer: Non-veteran care | Admitting: Vascular Surgery

## 2016-03-20 ENCOUNTER — Inpatient Hospital Stay (HOSPITAL_COMMUNITY)
Admission: RE | Admit: 2016-03-20 | Discharge: 2016-03-22 | DRG: 483 | Disposition: A | Discharge status: Home / Self Care | Payer: Non-veteran care | Source: Ambulatory Visit | Attending: Orthopedic Surgery | Admitting: Orthopedic Surgery

## 2016-03-20 DIAGNOSIS — M75101 Unspecified rotator cuff tear or rupture of right shoulder, not specified as traumatic: Secondary | ICD-10-CM | POA: Diagnosis present

## 2016-03-20 DIAGNOSIS — M12811 Other specific arthropathies, not elsewhere classified, right shoulder: Secondary | ICD-10-CM | POA: Diagnosis present

## 2016-03-20 DIAGNOSIS — Z87891 Personal history of nicotine dependence: Secondary | ICD-10-CM

## 2016-03-20 DIAGNOSIS — Z79891 Long term (current) use of opiate analgesic: Secondary | ICD-10-CM | POA: Diagnosis not present

## 2016-03-20 DIAGNOSIS — Z7984 Long term (current) use of oral hypoglycemic drugs: Secondary | ICD-10-CM

## 2016-03-20 DIAGNOSIS — I10 Essential (primary) hypertension: Secondary | ICD-10-CM | POA: Diagnosis present

## 2016-03-20 DIAGNOSIS — F329 Major depressive disorder, single episode, unspecified: Secondary | ICD-10-CM | POA: Diagnosis present

## 2016-03-20 DIAGNOSIS — G4730 Sleep apnea, unspecified: Secondary | ICD-10-CM | POA: Diagnosis present

## 2016-03-20 DIAGNOSIS — Z79899 Other long term (current) drug therapy: Secondary | ICD-10-CM

## 2016-03-20 DIAGNOSIS — Z96651 Presence of right artificial knee joint: Secondary | ICD-10-CM | POA: Diagnosis present

## 2016-03-20 DIAGNOSIS — E119 Type 2 diabetes mellitus without complications: Secondary | ICD-10-CM | POA: Diagnosis present

## 2016-03-20 DIAGNOSIS — Z96611 Presence of right artificial shoulder joint: Secondary | ICD-10-CM

## 2016-03-20 HISTORY — PX: REVERSE SHOULDER ARTHROPLASTY: SHX5054

## 2016-03-20 LAB — GLUCOSE, CAPILLARY
GLUCOSE-CAPILLARY: 158 mg/dL — AB (ref 65–99)
Glucose-Capillary: 171 mg/dL — ABNORMAL HIGH (ref 65–99)
Glucose-Capillary: 169 mg/dL — ABNORMAL HIGH (ref 65–99)
Glucose-Capillary: 120 mg/dL — ABNORMAL HIGH (ref 65–99)

## 2016-03-20 SURGERY — ARTHROPLASTY, SHOULDER, TOTAL, REVERSE
Anesthesia: General | Site: Shoulder | Laterality: Right

## 2016-03-20 MED ORDER — PANTOPRAZOLE SODIUM 40 MG PO TBEC
40.0000 mg | DELAYED_RELEASE_TABLET | Freq: Every day | ORAL | Status: DC
  Administered 2016-03-20 – 2016-03-22 (×3): 40 mg via ORAL
  Filled 2016-03-20 (×3): qty 1

## 2016-03-20 MED ORDER — ROCURONIUM BROMIDE 50 MG/5ML IV SOSY
PREFILLED_SYRINGE | INTRAVENOUS | Status: AC
  Filled 2016-03-20: qty 5

## 2016-03-20 MED ORDER — FLUOXETINE HCL 20 MG PO CAPS
60.0000 mg | ORAL_CAPSULE | Freq: Every day | ORAL | Status: DC
  Administered 2016-03-21 – 2016-03-22 (×2): 60 mg via ORAL
  Filled 2016-03-20 (×2): qty 3

## 2016-03-20 MED ORDER — PROPOFOL 10 MG/ML IV BOLUS
INTRAVENOUS | Status: AC
  Filled 2016-03-20: qty 20

## 2016-03-20 MED ORDER — METFORMIN HCL 500 MG PO TABS
1000.0000 mg | ORAL_TABLET | Freq: Two times a day (BID) | ORAL | Status: DC
  Administered 2016-03-20 – 2016-03-22 (×4): 1000 mg via ORAL
  Filled 2016-03-20 (×4): qty 2

## 2016-03-20 MED ORDER — FENTANYL CITRATE (PF) 100 MCG/2ML IJ SOLN
INTRAMUSCULAR | Status: DC | PRN
  Administered 2016-03-20: 50 ug via INTRAVENOUS

## 2016-03-20 MED ORDER — BISACODYL 10 MG RE SUPP: 10.0000 mg | Freq: Every day | RECTAL | Status: DC | PRN

## 2016-03-20 MED ORDER — SODIUM CHLORIDE 0.9 % IV SOLN
INTRAVENOUS | Status: DC
  Administered 2016-03-20: 15:00:00 via INTRAVENOUS

## 2016-03-20 MED ORDER — METHOCARBAMOL 500 MG PO TABS: 500.0000 mg | ORAL_TABLET | Freq: Three times a day (TID) | ORAL | 1 refills | Status: AC | PRN

## 2016-03-20 MED ORDER — FENTANYL CITRATE (PF) 100 MCG/2ML IJ SOLN
INTRAMUSCULAR | Status: AC
  Filled 2016-03-20: qty 2

## 2016-03-20 MED ORDER — METOCLOPRAMIDE HCL 5 MG PO TABS
5.0000 mg | ORAL_TABLET | Freq: Three times a day (TID) | ORAL | Status: DC | PRN
Start: 2016-03-20 — End: 2016-03-22

## 2016-03-20 MED ORDER — TAMSULOSIN HCL 0.4 MG PO CAPS
0.4000 mg | ORAL_CAPSULE | Freq: Every day | ORAL | Status: DC
  Administered 2016-03-20 – 2016-03-21 (×2): 0.4 mg via ORAL
  Filled 2016-03-20 (×2): qty 1

## 2016-03-20 MED ORDER — PIOGLITAZONE HCL 45 MG PO TABS
45.0000 mg | ORAL_TABLET | Freq: Every day | ORAL | Status: DC
  Administered 2016-03-20 – 2016-03-22 (×3): 45 mg via ORAL
  Filled 2016-03-20 (×3): qty 1

## 2016-03-20 MED ORDER — METOCLOPRAMIDE HCL 5 MG/ML IJ SOLN
5.0000 mg | Freq: Three times a day (TID) | INTRAMUSCULAR | Status: DC | PRN
Start: 2016-03-20 — End: 2016-03-22

## 2016-03-20 MED ORDER — ROCURONIUM BROMIDE 100 MG/10ML IV SOLN
INTRAVENOUS | Status: DC | PRN
  Administered 2016-03-20: 20 mg via INTRAVENOUS

## 2016-03-20 MED ORDER — BUPIVACAINE HCL (PF) 0.5 % IJ SOLN
INTRAMUSCULAR | Status: DC | PRN
  Administered 2016-03-20: 25 mL via PERINEURAL

## 2016-03-20 MED ORDER — HYDROMORPHONE HCL 1 MG/ML IJ SOLN: 0.2500 mg | INTRAMUSCULAR | Status: DC | PRN

## 2016-03-20 MED ORDER — TRAZODONE HCL 100 MG PO TABS
100.0000 mg | ORAL_TABLET | Freq: Every day | ORAL | Status: DC
  Administered 2016-03-20 – 2016-03-21 (×2): 100 mg via ORAL
  Filled 2016-03-20 (×2): qty 1

## 2016-03-20 MED ORDER — FENTANYL CITRATE (PF) 100 MCG/2ML IJ SOLN
INTRAMUSCULAR | Status: AC
  Filled 2016-03-20: qty 4

## 2016-03-20 MED ORDER — SUCCINYLCHOLINE CHLORIDE 200 MG/10ML IV SOSY
PREFILLED_SYRINGE | INTRAVENOUS | Status: DC | PRN
Start: 2016-03-20 — End: 2016-03-20
  Administered 2016-03-20: 150 mg via INTRAVENOUS

## 2016-03-20 MED ORDER — POLYETHYLENE GLYCOL 3350 17 G PO PACK: 17.0000 g | PACK | Freq: Every day | ORAL | Status: DC | PRN

## 2016-03-20 MED ORDER — GLIPIZIDE 5 MG PO TABS
20.0000 mg | ORAL_TABLET | Freq: Every day | ORAL | Status: DC
  Administered 2016-03-21 – 2016-03-22 (×2): 20 mg via ORAL
  Filled 2016-03-20 (×2): qty 4

## 2016-03-20 MED ORDER — OXYCODONE-ACETAMINOPHEN 5-325 MG PO TABS: 1.0000 | ORAL_TABLET | ORAL | 0 refills | Status: DC | PRN

## 2016-03-20 MED ORDER — EPINEPHRINE PF 1 MG/ML IJ SOLN
INTRAMUSCULAR | Status: AC
  Filled 2016-03-20: qty 1

## 2016-03-20 MED ORDER — BUPIVACAINE-EPINEPHRINE 0.25% -1:200000 IJ SOLN
INTRAMUSCULAR | Status: DC | PRN
  Administered 2016-03-20: 10 mL

## 2016-03-20 MED ORDER — INSULIN ASPART 100 UNIT/ML ~~LOC~~ SOLN
0.0000 [IU] | Freq: Three times a day (TID) | SUBCUTANEOUS | Status: DC
  Administered 2016-03-20: 4 [IU] via SUBCUTANEOUS
  Administered 2016-03-21: 3 [IU] via SUBCUTANEOUS
  Administered 2016-03-21: 11 [IU] via SUBCUTANEOUS
  Administered 2016-03-22: 4 [IU] via SUBCUTANEOUS

## 2016-03-20 MED ORDER — LOSARTAN POTASSIUM 25 MG PO TABS
25.0000 mg | ORAL_TABLET | Freq: Every day | ORAL | Status: DC
  Administered 2016-03-21 – 2016-03-22 (×2): 25 mg via ORAL
  Filled 2016-03-20 (×2): qty 1

## 2016-03-20 MED ORDER — LACTATED RINGERS IV SOLN
INTRAVENOUS | Status: DC
  Administered 2016-03-20: 10:00:00 via INTRAVENOUS

## 2016-03-20 MED ORDER — ONDANSETRON HCL 4 MG/2ML IJ SOLN
INTRAMUSCULAR | Status: DC | PRN
  Administered 2016-03-20: 4 mg via INTRAVENOUS

## 2016-03-20 MED ORDER — CEFAZOLIN SODIUM-DEXTROSE 2-4 GM/100ML-% IV SOLN
2.0000 g | Freq: Four times a day (QID) | INTRAVENOUS | Status: AC
  Administered 2016-03-20 – 2016-03-21 (×3): 2 g via INTRAVENOUS
  Filled 2016-03-20 (×3): qty 100

## 2016-03-20 MED ORDER — OXYCODONE HCL 5 MG PO TABS
5.0000 mg | ORAL_TABLET | ORAL | Status: DC | PRN
  Administered 2016-03-20 – 2016-03-21 (×3): 10 mg via ORAL
  Filled 2016-03-20 (×3): qty 2

## 2016-03-20 MED ORDER — SUGAMMADEX SODIUM 200 MG/2ML IV SOLN
INTRAVENOUS | Status: DC | PRN
  Administered 2016-03-20: 200 mg via INTRAVENOUS

## 2016-03-20 MED ORDER — MENTHOL 3 MG MT LOZG: 1.0000 | LOZENGE | OROMUCOSAL | Status: DC | PRN

## 2016-03-20 MED ORDER — CEFAZOLIN SODIUM-DEXTROSE 2-4 GM/100ML-% IV SOLN
INTRAVENOUS | Status: AC
  Filled 2016-03-20: qty 100

## 2016-03-20 MED ORDER — MIDAZOLAM HCL 2 MG/2ML IJ SOLN
INTRAMUSCULAR | Status: AC
  Filled 2016-03-20: qty 2

## 2016-03-20 MED ORDER — INSULIN ASPART 100 UNIT/ML ~~LOC~~ SOLN: 0.0000 [IU] | Freq: Every day | SUBCUTANEOUS | Status: DC

## 2016-03-20 MED ORDER — CHLORHEXIDINE GLUCONATE 4 % EX LIQD: 60.0000 mL | Freq: Once | CUTANEOUS | Status: DC

## 2016-03-20 MED ORDER — PRAVASTATIN SODIUM 40 MG PO TABS
60.0000 mg | ORAL_TABLET | Freq: Every day | ORAL | Status: DC
  Administered 2016-03-20 – 2016-03-21 (×2): 60 mg via ORAL
  Filled 2016-03-20 (×2): qty 1

## 2016-03-20 MED ORDER — ONDANSETRON HCL 4 MG/2ML IJ SOLN
INTRAMUSCULAR | Status: AC
  Filled 2016-03-20: qty 2

## 2016-03-20 MED ORDER — PHENYLEPHRINE HCL 10 MG/ML IJ SOLN
INTRAVENOUS | Status: DC | PRN
  Administered 2016-03-20: 13:00:00 via INTRAVENOUS
  Administered 2016-03-20: 25 ug/min via INTRAVENOUS

## 2016-03-20 MED ORDER — HYDROMORPHONE HCL 2 MG/ML IJ SOLN
0.5000 mg | INTRAMUSCULAR | Status: DC | PRN
  Administered 2016-03-20 – 2016-03-21 (×4): 1 mg via INTRAVENOUS
  Filled 2016-03-20 (×4): qty 1

## 2016-03-20 MED ORDER — ONDANSETRON HCL 4 MG/2ML IJ SOLN: 4.0000 mg | Freq: Four times a day (QID) | INTRAMUSCULAR | Status: DC | PRN

## 2016-03-20 MED ORDER — PROPOFOL 10 MG/ML IV BOLUS
INTRAVENOUS | Status: DC | PRN
  Administered 2016-03-20: 50 mg via INTRAVENOUS
  Administered 2016-03-20: 200 mg via INTRAVENOUS

## 2016-03-20 MED ORDER — GLIPIZIDE 5 MG PO TABS: 10.0000 mg | ORAL_TABLET | Freq: Two times a day (BID) | ORAL | Status: DC

## 2016-03-20 MED ORDER — SUGAMMADEX SODIUM 200 MG/2ML IV SOLN
INTRAVENOUS | Status: AC
  Filled 2016-03-20: qty 2

## 2016-03-20 MED ORDER — ASPIRIN EC 81 MG PO TBEC
81.0000 mg | DELAYED_RELEASE_TABLET | Freq: Every day | ORAL | Status: DC
  Administered 2016-03-20 – 2016-03-22 (×3): 81 mg via ORAL
  Filled 2016-03-20 (×3): qty 1

## 2016-03-20 MED ORDER — FAMOTIDINE 20 MG PO TABS
20.0000 mg | ORAL_TABLET | Freq: Two times a day (BID) | ORAL | Status: DC
  Administered 2016-03-20 – 2016-03-22 (×5): 20 mg via ORAL
  Filled 2016-03-20 (×5): qty 1

## 2016-03-20 MED ORDER — PROMETHAZINE HCL 25 MG/ML IJ SOLN: 6.2500 mg | INTRAMUSCULAR | Status: DC | PRN

## 2016-03-20 MED ORDER — INSULIN ASPART 100 UNIT/ML ~~LOC~~ SOLN
6.0000 [IU] | Freq: Three times a day (TID) | SUBCUTANEOUS | Status: DC
  Administered 2016-03-20 – 2016-03-22 (×5): 6 [IU] via SUBCUTANEOUS

## 2016-03-20 MED ORDER — EPHEDRINE SULFATE 50 MG/ML IJ SOLN
INTRAMUSCULAR | Status: DC | PRN
  Administered 2016-03-20 (×3): 10 mg via INTRAVENOUS

## 2016-03-20 MED ORDER — GLIPIZIDE 5 MG PO TABS
10.0000 mg | ORAL_TABLET | Freq: Every day | ORAL | Status: DC
  Administered 2016-03-20 – 2016-03-21 (×2): 10 mg via ORAL
  Filled 2016-03-20 (×2): qty 2

## 2016-03-20 MED ORDER — DONEPEZIL HCL 10 MG PO TABS
10.0000 mg | ORAL_TABLET | Freq: Every day | ORAL | Status: DC
  Administered 2016-03-20 – 2016-03-21 (×2): 10 mg via ORAL
  Filled 2016-03-20 (×2): qty 1

## 2016-03-20 MED ORDER — METHOCARBAMOL 500 MG PO TABS
500.0000 mg | ORAL_TABLET | Freq: Four times a day (QID) | ORAL | Status: DC | PRN
  Administered 2016-03-20: 500 mg via ORAL
  Filled 2016-03-20: qty 1

## 2016-03-20 MED ORDER — LEVOTHYROXINE SODIUM 50 MCG PO TABS
50.0000 ug | ORAL_TABLET | Freq: Every day | ORAL | Status: DC
  Administered 2016-03-20 – 2016-03-22 (×3): 50 ug via ORAL
  Filled 2016-03-20 (×3): qty 1

## 2016-03-20 MED ORDER — METHOCARBAMOL 1000 MG/10ML IJ SOLN
500.0000 mg | Freq: Four times a day (QID) | INTRAVENOUS | Status: DC | PRN
  Filled 2016-03-20: qty 5

## 2016-03-20 MED ORDER — DEXTROSE 5 % IV SOLN
3.0000 g | INTRAVENOUS | Status: AC
  Administered 2016-03-20: 3 g via INTRAVENOUS
  Filled 2016-03-20 (×2): qty 3000

## 2016-03-20 MED ORDER — METOPROLOL TARTRATE 25 MG PO TABS
25.0000 mg | ORAL_TABLET | Freq: Two times a day (BID) | ORAL | Status: DC
  Administered 2016-03-20 – 2016-03-22 (×4): 25 mg via ORAL
  Filled 2016-03-20 (×4): qty 1

## 2016-03-20 MED ORDER — PHENOL 1.4 % MT LIQD: 1.0000 | OROMUCOSAL | Status: DC | PRN

## 2016-03-20 MED ORDER — LIDOCAINE-EPINEPHRINE (PF) 1.5 %-1:200000 IJ SOLN
INTRAMUSCULAR | Status: DC | PRN
  Administered 2016-03-20: 15 mL via PERINEURAL

## 2016-03-20 MED ORDER — EPHEDRINE 5 MG/ML INJ
INTRAVENOUS | Status: AC
  Filled 2016-03-20: qty 10

## 2016-03-20 MED ORDER — ACETAMINOPHEN 650 MG RE SUPP: 650.0000 mg | Freq: Four times a day (QID) | RECTAL | Status: DC | PRN

## 2016-03-20 MED ORDER — MIDAZOLAM HCL 5 MG/5ML IJ SOLN
INTRAMUSCULAR | Status: DC | PRN
  Administered 2016-03-20: 1 mg via INTRAVENOUS

## 2016-03-20 MED ORDER — ONDANSETRON HCL 4 MG PO TABS: 4.0000 mg | ORAL_TABLET | Freq: Four times a day (QID) | ORAL | Status: DC | PRN

## 2016-03-20 MED ORDER — SODIUM CHLORIDE 0.9 % IR SOLN
Status: DC | PRN
  Administered 2016-03-20: 1000 mL

## 2016-03-20 MED ORDER — LIDOCAINE HCL (CARDIAC) 20 MG/ML IV SOLN
INTRAVENOUS | Status: DC | PRN
  Administered 2016-03-20: 80 mg via INTRAVENOUS

## 2016-03-20 MED ORDER — LIDOCAINE 2% (20 MG/ML) 5 ML SYRINGE
INTRAMUSCULAR | Status: AC
  Filled 2016-03-20: qty 5

## 2016-03-20 MED ORDER — ACETAMINOPHEN 325 MG PO TABS
650.0000 mg | ORAL_TABLET | Freq: Four times a day (QID) | ORAL | Status: DC | PRN
  Administered 2016-03-21 – 2016-03-22 (×3): 650 mg via ORAL
  Filled 2016-03-20 (×3): qty 2

## 2016-03-20 MED ORDER — KETOROLAC TROMETHAMINE 15 MG/ML IJ SOLN: 7.5000 mg | Freq: Four times a day (QID) | INTRAMUSCULAR | Status: AC | PRN

## 2016-03-20 MED ORDER — SUCCINYLCHOLINE CHLORIDE 200 MG/10ML IV SOSY
PREFILLED_SYRINGE | INTRAVENOUS | Status: AC
  Filled 2016-03-20: qty 10

## 2016-03-20 MED ORDER — CLONAZEPAM 0.5 MG PO TABS
0.5000 mg | ORAL_TABLET | Freq: Every day | ORAL | Status: DC
  Administered 2016-03-20 – 2016-03-21 (×2): 0.5 mg via ORAL
  Filled 2016-03-20 (×2): qty 1

## 2016-03-20 MED ORDER — BUPIVACAINE HCL (PF) 0.25 % IJ SOLN
INTRAMUSCULAR | Status: AC
  Filled 2016-03-20: qty 30

## 2016-03-20 SURGICAL SUPPLY — 64 items
BIT DRILL 5/64X5 DISP (BIT) IMPLANT
BLADE SAG 18X100X1.27 (BLADE) ×3 IMPLANT
CAPT SHLDR REVTOTAL 1 ×3 IMPLANT
CLOSURE WOUND 1/2 X4 (GAUZE/BANDAGES/DRESSINGS) ×1
COVER SURGICAL LIGHT HANDLE (MISCELLANEOUS) ×3 IMPLANT
DRAPE IMP U-DRAPE 54X76 (DRAPES) ×6 IMPLANT
DRAPE INCISE IOBAN 66X45 STRL (DRAPES) ×3 IMPLANT
DRAPE ORTHO SPLIT 77X108 STRL (DRAPES) ×4
DRAPE SURG ORHT 6 SPLT 77X108 (DRAPES) ×2 IMPLANT
DRAPE U-SHAPE 47X51 STRL (DRAPES) ×3 IMPLANT
DRAPE X-RAY CASS 24X20 (DRAPES) IMPLANT
DRSG ADAPTIC 3X8 NADH LF (GAUZE/BANDAGES/DRESSINGS) ×3 IMPLANT
DRSG PAD ABDOMINAL 8X10 ST (GAUZE/BANDAGES/DRESSINGS) ×3 IMPLANT
DURAPREP 26ML APPLICATOR (WOUND CARE) ×3 IMPLANT
ELECT BLADE 4.0 EZ CLEAN MEGAD (MISCELLANEOUS) ×3
ELECT NEEDLE TIP 2.8 STRL (NEEDLE) ×3 IMPLANT
ELECT REM PT RETURN 9FT ADLT (ELECTROSURGICAL) ×3
ELECTRODE BLDE 4.0 EZ CLN MEGD (MISCELLANEOUS) ×1 IMPLANT
ELECTRODE REM PT RTRN 9FT ADLT (ELECTROSURGICAL) ×1 IMPLANT
FILTER STRAW FLUID ASPIR (MISCELLANEOUS) ×3 IMPLANT
GAUZE SPONGE 4X4 12PLY STRL (GAUZE/BANDAGES/DRESSINGS) ×3 IMPLANT
GLOVE BIOGEL PI ORTHO PRO 7.5 (GLOVE) ×2
GLOVE BIOGEL PI ORTHO PRO SZ8 (GLOVE) ×2
GLOVE ORTHO TXT STRL SZ7.5 (GLOVE) ×3 IMPLANT
GLOVE PI ORTHO PRO STRL 7.5 (GLOVE) ×1 IMPLANT
GLOVE PI ORTHO PRO STRL SZ8 (GLOVE) ×1 IMPLANT
GLOVE SURG ORTHO 8.5 STRL (GLOVE) ×3 IMPLANT
GOWN STRL REUS W/ TWL LRG LVL3 (GOWN DISPOSABLE) ×1 IMPLANT
GOWN STRL REUS W/ TWL XL LVL3 (GOWN DISPOSABLE) ×2 IMPLANT
GOWN STRL REUS W/TWL LRG LVL3 (GOWN DISPOSABLE) ×2
GOWN STRL REUS W/TWL XL LVL3 (GOWN DISPOSABLE) ×4
HANDPIECE INTERPULSE COAX TIP (DISPOSABLE)
KIT BASIN OR (CUSTOM PROCEDURE TRAY) ×3 IMPLANT
KIT ROOM TURNOVER OR (KITS) ×3 IMPLANT
MANIFOLD NEPTUNE II (INSTRUMENTS) ×3 IMPLANT
NEEDLE 1/2 CIR MAYO (NEEDLE) IMPLANT
NEEDLE 18GX1X1/2 (RX/OR ONLY) (NEEDLE) ×3 IMPLANT
NEEDLE HYPO 25GX1X1/2 BEV (NEEDLE) ×3 IMPLANT
NS IRRIG 1000ML POUR BTL (IV SOLUTION) ×3 IMPLANT
PACK SHOULDER (CUSTOM PROCEDURE TRAY) ×3 IMPLANT
PAD ARMBOARD 7.5X6 YLW CONV (MISCELLANEOUS) ×6 IMPLANT
SET HNDPC FAN SPRY TIP SCT (DISPOSABLE) IMPLANT
SLING ARM LRG ADULT FOAM STRAP (SOFTGOODS) ×3 IMPLANT
SLING ARM MED ADULT FOAM STRAP (SOFTGOODS) IMPLANT
SPONGE LAP 18X18 X RAY DECT (DISPOSABLE) IMPLANT
SPONGE LAP 4X18 X RAY DECT (DISPOSABLE) ×3 IMPLANT
STRIP CLOSURE SKIN 1/2X4 (GAUZE/BANDAGES/DRESSINGS) ×2 IMPLANT
SUCTION FRAZIER HANDLE 10FR (MISCELLANEOUS) ×2
SUCTION TUBE FRAZIER 10FR DISP (MISCELLANEOUS) ×1 IMPLANT
SUT FIBERWIRE #2 38 T-5 BLUE (SUTURE) ×6
SUT MNCRL AB 4-0 PS2 18 (SUTURE) ×3 IMPLANT
SUT VIC AB 0 CT2 27 (SUTURE) ×3 IMPLANT
SUT VIC AB 2-0 CT1 27 (SUTURE) ×2
SUT VIC AB 2-0 CT1 TAPERPNT 27 (SUTURE) ×1 IMPLANT
SUT VICRYL 0 CT 1 36IN (SUTURE) ×3 IMPLANT
SUTURE FIBERWR #2 38 T-5 BLUE (SUTURE) ×2 IMPLANT
SYR CONTROL 10ML LL (SYRINGE) ×3 IMPLANT
SYR TB 1ML LUER SLIP (SYRINGE) ×3 IMPLANT
TOWEL OR 17X24 6PK STRL BLUE (TOWEL DISPOSABLE) ×3 IMPLANT
TOWEL OR 17X26 10 PK STRL BLUE (TOWEL DISPOSABLE) ×3 IMPLANT
TOWER CARTRIDGE SMART MIX (DISPOSABLE) IMPLANT
TRAY FOLEY CATH 16FRSI W/METER (SET/KITS/TRAYS/PACK) IMPLANT
WATER STERILE IRR 1000ML POUR (IV SOLUTION) ×3 IMPLANT
YANKAUER SUCT BULB TIP NO VENT (SUCTIONS) ×3 IMPLANT

## 2016-03-20 NOTE — Interval H&P Note (Signed)
History and Physical Interval Note:  03/20/2016 11:00 AM  Charles Allison  has presented today for surgery, with the diagnosis of Right shoulder rotator cuff tear arthropathy  The various methods of treatment have been discussed with the patient and family. After consideration of risks, benefits and other options for treatment, the patient has consented to  Procedure(s): REVERSE SHOULDER ARTHROPLASTY (Right) as a surgical intervention .  The patient's history has been reviewed, patient examined, no change in status, stable for surgery.  I have reviewed the patient's chart and labs.  Questions were answered to the patient's satisfaction.     Jamiere Gulas,STEVEN R

## 2016-03-20 NOTE — Brief Op Note (Signed)
03/20/2016  1:27 PM  PATIENT:  Charles Allison  71 y.o. male  PRE-OPERATIVE DIAGNOSIS:  Right shoulder rotator cuff tear arthropathy  POST-OPERATIVE DIAGNOSIS:  Right shoulder rotator cuff tear arthropathy  PROCEDURE:  Procedure(s): REVERSE SHOULDER ARTHROPLASTY (Right) DePuy Delta Xtend  SURGEON:  Surgeon(s) and Role:    * Beverely LowSteve Bashar Milam, MD - Primary  PHYSICIAN ASSISTANT:   ASSISTANTS: Thea Gisthomas B Dixon, PA-C   ANESTHESIA:   regional and general  EBL:  Total I/O In: -  Out: 200 [Blood:200]  BLOOD ADMINISTERED:none  DRAINS: none   LOCAL MEDICATIONS USED:  MARCAINE     SPECIMEN:  No Specimen  DISPOSITION OF SPECIMEN:  N/A  COUNTS:  YES  TOURNIQUET:  * No tourniquets in log *  DICTATION: .Other Dictation: Dictation Number [id number]  PLAN OF CARE: Admit to inpatient   PATIENT DISPOSITION:  PACU - hemodynamically stable.   Delay start of Pharmacological VTE agent (>24hrs) due to surgical blood loss or risk of bleeding: not applicable

## 2016-03-20 NOTE — Transfer of Care (Signed)
Immediate Anesthesia Transfer of Care Note  Patient: Charles Allison  Procedure(s) Performed: Procedure(s): REVERSE SHOULDER ARTHROPLASTY (Right)  Patient Location: PACU  Anesthesia Type:GA combined with regional for post-op pain  Level of Consciousness: awake and alert   Airway & Oxygen Therapy: Patient Spontanous Breathing and Patient connected to face mask oxygen  Post-op Assessment: Report given to RN and Post -op Vital signs reviewed and stable  Post vital signs: Reviewed and stable  Last Vitals:  Vitals:   03/20/16 1005 03/20/16 1330  BP: (!) 177/77 139/71  Pulse: 63 61  Resp: 20 19  Temp:  36.4 C    Last Pain:  Vitals:   03/20/16 1330  TempSrc:   PainSc: Asleep      Patients Stated Pain Goal: 3 (03/20/16 1003)  Complications: No apparent anesthesia complications

## 2016-03-20 NOTE — Anesthesia Preprocedure Evaluation (Signed)
Anesthesia Evaluation  Patient identified by MRN, date of birth, ID band Patient awake    Reviewed: Allergy & Precautions, NPO status , Patient's Chart, lab work & pertinent test results  Airway Mallampati: III  TM Distance: <3 FB Neck ROM: Full    Dental no notable dental hx.    Pulmonary sleep apnea , former smoker,    Pulmonary exam normal breath sounds clear to auscultation       Cardiovascular hypertension, Pt. on medications Normal cardiovascular exam Rhythm:Regular Rate:Normal     Neuro/Psych negative neurological ROS  negative psych ROS   GI/Hepatic negative GI ROS, Neg liver ROS,   Endo/Other  diabetesHypothyroidism Morbid obesity  Renal/GU negative Renal ROS  negative genitourinary   Musculoskeletal negative musculoskeletal ROS (+)   Abdominal   Peds negative pediatric ROS (+)  Hematology negative hematology ROS (+)   Anesthesia Other Findings   Reproductive/Obstetrics negative OB ROS                             Anesthesia Physical Anesthesia Plan  ASA: III  Anesthesia Plan: General   Post-op Pain Management: GA combined w/ Regional for post-op pain   Induction: Intravenous  Airway Management Planned: Oral ETT  Additional Equipment:   Intra-op Plan:   Post-operative Plan: Extubation in OR  Informed Consent: I have reviewed the patients History and Physical, chart, labs and discussed the procedure including the risks, benefits and alternatives for the proposed anesthesia with the patient or authorized representative who has indicated his/her understanding and acceptance.   Dental advisory given  Plan Discussed with: CRNA and Surgeon  Anesthesia Plan Comments:         Anesthesia Quick Evaluation

## 2016-03-20 NOTE — Anesthesia Procedure Notes (Signed)
Procedure Name: Intubation Date/Time: 03/20/2016 11:19 AM Performed by: Charles LimerickHARDER, Charles Allison Pre-anesthesia Checklist: Patient identified, Emergency Drugs available, Suction available and Patient being monitored Patient Re-evaluated:Patient Re-evaluated prior to inductionOxygen Delivery Method: Circle System Utilized Preoxygenation: Pre-oxygenation with 100% oxygen Intubation Type: IV induction Ventilation: Two handed mask ventilation required Laryngoscope Size: Glidescope and 4 Grade View: Grade I Tube type: Oral Tube size: 7.5 mm Number of attempts: 1 Airway Equipment and Method: Stylet and Video-laryngoscopy Placement Confirmation: ETT inserted through vocal cords under direct vision,  positive ETCO2 and breath sounds checked- equal and bilateral Secured at: 23 cm Tube secured with: Tape Dental Injury: Teeth and Oropharynx as per pre-operative assessment  Difficulty Due To: Difficulty was anticipated

## 2016-03-20 NOTE — Anesthesia Procedure Notes (Signed)
Anesthesia Regional Block:  Interscalene brachial plexus block  Pre-Anesthetic Checklist: ,, timeout performed, Correct Patient, Correct Site, Correct Laterality, Correct Procedure, Correct Position, site marked, Risks and benefits discussed,  Surgical consent,  Pre-op evaluation,  At surgeon's request and post-op pain management  Laterality: Right  Prep: chloraprep       Needles:  Injection technique: Single-shot  Needle Type: Echogenic Needle     Needle Length: 9cm 9 cm Needle Gauge: 21 G    Additional Needles:  Procedures: ultrasound guided (picture in chart) Interscalene brachial plexus block Narrative:  Start time: 03/20/2016 10:30 AM End time: 03/20/2016 10:40 AM Injection made incrementally with aspirations every 5 mL.  Performed by: Personally  Anesthesiologist: Stanley Lyness  Additional Notes: Patient tolerated the procedure well without complications

## 2016-03-20 NOTE — Discharge Instructions (Signed)
Ice to the shoulder at least 4 times per day for 20 minutes  Keep the shoulder dressing in place until Friday then change the bandage every three days with the Gel Bandages  No shower for one week, then ok to get incision wet in the shower.  Ok to remove the sling in the house and use the right arm for light activity.   Use the sling out of the house  No heavy push pull or lift with the right arm.  Keep pillow or rolled blanked behind the elbow to keep the arm across your abdomen while resting or sleeping.  Follow up with Dr Ranell PatrickNorris in two weeks in the office [phone]

## 2016-03-21 ENCOUNTER — Encounter (HOSPITAL_COMMUNITY): Payer: Self-pay

## 2016-03-21 LAB — BASIC METABOLIC PANEL
ANION GAP: 8 (ref 5–15)
BUN: 19 mg/dL (ref 6–20)
CHLORIDE: 102 mmol/L (ref 101–111)
CO2: 26 mmol/L (ref 22–32)
Calcium: 8.6 mg/dL — ABNORMAL LOW (ref 8.9–10.3)
Creatinine, Ser: 1.33 mg/dL — ABNORMAL HIGH (ref 0.61–1.24)
GFR calc Af Amer: >60 mL/min (ref >60)
GFR calc non Af Amer: 53 mL/min — ABNORMAL LOW (ref >60)
Glucose, Bld: 217 mg/dL — ABNORMAL HIGH (ref 65–99)
POTASSIUM: 4.8 mmol/L (ref 3.5–5.1)
SODIUM: 136 mmol/L (ref 135–145)

## 2016-03-21 LAB — GLUCOSE, CAPILLARY
GLUCOSE-CAPILLARY: 271 mg/dL — AB (ref 65–99)
GLUCOSE-CAPILLARY: 140 mg/dL — AB (ref 65–99)

## 2016-03-21 LAB — HEMOGLOBIN AND HEMATOCRIT, BLOOD
HCT: 38.2 % — ABNORMAL LOW (ref 39.0–52.0)
Hemoglobin: 12.2 g/dL — ABNORMAL LOW (ref 13.0–17.0)

## 2016-03-21 MED ORDER — HYDROMORPHONE HCL 2 MG/ML IJ SOLN: 0.2500 mg | INTRAMUSCULAR | Status: DC | PRN

## 2016-03-21 MED ORDER — OXYCODONE HCL 5 MG PO TABS
5.0000 mg | ORAL_TABLET | ORAL | Status: DC | PRN
  Administered 2016-03-22: 5 mg via ORAL
  Filled 2016-03-21: qty 1

## 2016-03-21 NOTE — Anesthesia Postprocedure Evaluation (Signed)
Anesthesia Post Note  Patient: Charles Allison  Procedure(s) Performed: Procedure(s) (LRB): REVERSE SHOULDER ARTHROPLASTY (Right)  Patient location during evaluation: PACU Anesthesia Type: General and Regional Level of consciousness: awake and alert Pain management: pain level controlled Vital Signs Assessment: post-procedure vital signs reviewed and stable Respiratory status: spontaneous breathing, nonlabored ventilation, respiratory function stable and patient connected to nasal cannula oxygen Cardiovascular status: blood pressure returned to baseline and stable Postop Assessment: no signs of nausea or vomiting Anesthetic complications: no       Last Vitals:  Vitals:   03/20/16 2205 03/21/16 0455  BP: (!) 148/61 (!) 113/54  Pulse: 61 82  Resp:    Temp: 37.5 C 37.2 C    Last Pain:  Vitals:   03/21/16 0555  TempSrc:   PainSc: Asleep                 Jatin Naumann S

## 2016-03-21 NOTE — Op Note (Signed)
NAMECAINEN, BURNHAM NO.:  [id number]  MEDICAL RECORD NO.:  [id number]  LOCATION:                                 FACILITY:  PHYSICIAN:  Almedia Balls. Ranell Patrick, M.D. DATE OF BIRTH:  08/23/1945  DATE OF PROCEDURE:  03/20/2016 DATE OF DISCHARGE:                              OPERATIVE REPORT   PREOPERATIVE DIAGNOSIS:  Right shoulder rotator cuff tear arthropathy.  POSTOPERATIVE DIAGNOSIS:  Right shoulder rotator cuff tear arthropathy.  PROCEDURE PERFORMED:  Right shoulder reverse total shoulder arthroplasty using DePuy Delta Xtend prosthesis.  ATTENDING SURGEON:  Almedia Balls. Ranell Patrick, MD.  ASSISTANT:  Thea Gist, PA-C, who scrubbed the entire procedure and necessary for satisfactory completion of surgery.  ANESTHESIA:  General anesthesia was used plus interscalene block.  ESTIMATED BLOOD LOSS:  200.  FLUID REPLACEMENT:  1200 mL crystalloid.  INSTRUMENT COUNTS:  Correct.  COMPLICATIONS:  There were no complications.  ANTIBIOTICS:  Perioperative antibiotics were given.  INDICATIONS:  The patient is a 71 year old male with progressive right shoulder pain secondary to rotator cuff tear arthropathy.  Despite conservative management, the patient has had progressive pain and very limited motion and function.  The patient desires total shoulder arthroplasty with reverse shoulder replacement to restore fixed fulcrum mechanics and restore some function and eliminate pain.  Informed consent obtained.  DESCRIPTION OF PROCEDURE:  After an adequate level of anesthesia achieved, the patient was positioned in the modified beach-chair position.  Right shoulder correctly identified and sterilely prepped and draped in usual manner.  A time-out was called.  We entered the shoulder using a standard deltopectoral anterior approach.  Dissection down through subcutaneous tissues using Bovie.  We identified the cephalic vein, took it laterally with the deltoid, the pectoralis  taken medially. Conjoint tendon identified and retracted medially.  The biceps was tenodesed in situ with 0 Vicryl figure-of-eight sutures into the pec origin as a soft tissue tenodesis.  We then went ahead and removed the subscapularis which was extremely contracted and degenerative off the lesser tuberosity.  We tagged it for retraction and protection of the axillary nerve.  We released the capsule off the inferior humeral neck. We progressively externally rotated extending the shoulder and the humeral head was delivered out of the wound.  The teres minor appeared to be intact by palpation.  Supraspinatus and infraspinatus were completely gone.  Complete erosion of the humeral head and loss of cartilage.  At this point, we entered the proximal humerus with a 6 mm reamer, reamed up to a size 12.  We then placed our 12 intramedullary resection guide and resected the humeral head at 10 degrees of retroversion using oscillating saw.  We took that to back table to use for bone graft.  We then removed excess osteophytes medially off the humerus all the way rather posteriorly.  We then reamed for the epi-2 right metaphyseal component.  Once we did that reaming, we took our 12 stem epi-2 right, set on the 0 setting, placed in 10 degrees of retroversion, impacted in position.  We then retracted the humerus posteriorly, did a 360-degree glenoid labrum removal as well as biceps stump removal.  We then  removed the capsule.  We were careful to protect the axillary nerve.  Once we had good exposure of this retroverted glenoid, we placed our guide pin correcting the retroversion into the scapular neck at the appropriate position.  We then reamed for the metaglene and drilled our central peg hole.  We impacted the metaglene in position, made sure we had done our peripheral hand reaming so that the glenosphere would seat.  We placed our screws to anchor the metaglene in position including 36 inferiorly  and superiorly and then a 24 anteriorly and an 18 nonlocked posteriorly.  All the screws locked and we had great purchase.  Very, very strong and hard bone there in the glenoid.  We then went ahead and placed our 42 eccentric glenosphere into position with the eccentricity down to anteriorly and inferiorly to give best coverage.  I was able to take a finger sweep and had an excellent coverage around the base of the scapula and the glenoid was free and clear.  The axillary nerve was in good position and out of the area.  We next went ahead and reduced the shoulder with a 42+ 3, felt like we got a 42 +6.  At this point, we irrigated the humerus, removed the trial components, and then using impaction grafting technique with bone graft from the humeral head, we impacted the HA coated press-fit, 12 body, epi-2 right metaphysis on the 0 setting, placed in 10 degrees of retroversion, that was impacted with excellent fit and very secure rotationally.  I then took a 42 +6 trial and reduced the shoulder, felt like that was appropriate offset for the shoulder.  We then selected the real 42 +6 poly, and impacted that on the humeral side.  We then reduced the shoulder with nice pop.  We had a nice and tight conjoint, not overly tight, and I was able to pull on the arm and there was no gapping at all and everything moved together as a unit, progressive external rotation and no gapping.  We irrigated thoroughly and we removed the stump of the subscapularis.  Next, we checked for bleeding and none was encountered. Bovie was used to control the bleeding during the case.  We irrigated thoroughly with normal saline for another liter and then repaired the deltopectoral interval with 0 Vicryl suture followed by 2-0 Vicryl for subcutaneous closure and 4-0 Monocryl for skin.  Steri-Strips applied followed by a sterile dressing.  The patient tolerated the surgery well.     Almedia BallsSteven R. Ranell PatrickNorris,  M.D.   ______________________________ Almedia BallsSteven R. Ranell PatrickNorris, M.D.    SRN/MEDQ  D:  03/20/2016  T:  03/21/2016  Job:  [id number]

## 2016-03-21 NOTE — Progress Notes (Signed)
Patient update given to Dr, Ranell PatrickNorris via phone regarding pt activity for the day. Sats with 02 have been between 93-94%. Up in the recliner chair most of the day. Participated with PT and OT and tolerated well. Medicated with tylenol only for discomfort with relief obtained. Pt will need CPAP this PM for HS. Pt has his own mask. Family plans to return to hospital in the am prepared to transport pt home.

## 2016-03-21 NOTE — Progress Notes (Signed)
Occupational Therapy Treatment Patient Details Name: Charles Allison MRN: [id number] DOB: Sep 23, 1945 Today's Date: 03/21/2016    History of present illness 71 y/o male s/p REVERSE SHOULDER ARTHROPLASTY (Right) DePuy Delta Xtend. Pt has a past medical history including but not limited to Arthritis; Depression; Diabetes mellitus without complication; Dyspnea; cardiomegaly; Hypertension; and Sleep apnea.  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   OT comments  Pt making progress towards OT goals this session. Completed full HEP and reviewed shoulder handout in full. Pt able to recall approx 50% of it from earlier session. Pt in significant pain and still willing to participate in HEP. Pt still with mild confusion and changing topics suddenly (and unrelated) during conversation. Pt will benefit from continued skilled OT in the acute setting to reinforce precautions, and maximize safety and independence in ADL.   Follow Up Recommendations  Supervision/Assistance - 24 hour (initially; progress rehab of shoulder as ordered by MD)    Equipment Recommendations  None recommended by OT    Recommendations for Other Services      Precautions / Restrictions Precautions Precautions: Shoulder Type of Shoulder Precautions: Active Protocol Shoulder Interventions: Shoulder sling/immobilizer;For comfort Precaution Booklet Issued: Yes (comment) Precaution Comments: OT shoulder protocol handout and HEP reviewed in full Required Braces or Orthoses: Sling Restrictions Weight Bearing Restrictions: Yes RUE Weight Bearing: Non weight bearing       Mobility Bed Mobility               General bed mobility comments: Pt sitting OOB in recliner when OT entered the room  Transfers Overall transfer level: Needs assistance Equipment used: None Transfers: Sit to/from Stand Sit to Stand: Min guard         General transfer comment:  with sling on, no attempt to use RUE    Balance Overall balance assessment: Needs assistance Sitting-balance support: No upper extremity supported;Feet supported Sitting balance-Leahy Scale: Good     Standing balance support: No upper extremity supported;During functional activity Standing balance-Leahy Scale: Fair                     ADL Overall ADL's : Needs assistance/impaired Eating/Feeding: Moderate assistance;With caregiver independent assisting;Bed level   Grooming: Wash/dry face;Set up;Sitting Grooming Details (indicate cue type and reason): sitting in recliner             Lower Body Dressing: Moderate assistance;Sit to/from stand Lower Body Dressing Details (indicate cue type and reason): OT assist to don underwear Toilet Transfer: Minimal assistance;Comfort height toilet Toilet Transfer Details (indicate cue type and reason): simulated with recliner         Functional mobility during ADLs: Min guard General ADL Comments: Please see shoulder section below for more detailed information      Vision                     Perception     Praxis      Cognition   Behavior During Therapy: [facility]FL for tasks assessed/performed Overall Cognitive Status: Within Functional Limits for tasks assessed                  General Comments: Pt continues to demonstrate mild confusion during conversation. Stated "I need everything youre telling me to be written down"    Extremity/Trunk Assessment  Upper Extremity Assessment Upper Extremity Assessment: RUE deficits/detail RUE Deficits / Details: post-op deficits in strength and ROM RUE: Unable to fully  assess due to pain;Unable to fully assess due to immobilization RUE Sensation:  (Pt states that anterior side of forearm is hypersensitive)   Lower Extremity Assessment Lower Extremity Assessment: Overall WFL for tasks assessed (history of BLE TKA)   Cervical / Trunk Assessment Cervical / Trunk  Assessment: Normal    Exercises Shoulder Exercises Shoulder Flexion: PROM;AROM;Right;10 reps;Supine (able to get to 90 degrees with elbow bent in) Shoulder ABduction: PROM;AROM;Right;10 reps;Seated (able to get to 60 derees this session) Shoulder External Rotation: PROM;AROM;Right;10 reps;Seated (able to get to 15 degrees this session) Elbow Flexion: AROM;Right;10 reps;Seated;Standing Elbow Extension: AROM;Right;10 reps;15 reps;Seated;Standing Wrist Flexion: AROM;Right Wrist Extension: AROM;Right Digit Composite Flexion: AROM;Right Neck Flexion: AROM Neck Extension: AROM Neck Lateral Flexion - Right: AROM Neck Lateral Flexion - Left: AROM Donning/doffing shirt without moving shoulder: Maximal assistance Method for sponge bathing under operated UE: Minimal assistance Donning/doffing sling/immobilizer: Maximal assistance;Patient able to independently direct caregiver Correct positioning of sling/immobilizer: Maximal assistance;Patient able to independently direct caregiver;Caregiver independent with task ROM for elbow, wrist and digits of operated UE: Supervision/safety Sling wearing schedule (on at all times/off for ADL's): Modified independent Proper positioning of operated UE when showering: Moderate assistance;Patient able to independently direct caregiver Positioning of UE while sleeping: Moderate assistance;Patient able to independently direct caregiver   Shoulder Instructions Shoulder Instructions Donning/doffing shirt without moving shoulder: Maximal assistance Method for sponge bathing under operated UE: Minimal assistance Donning/doffing sling/immobilizer: Maximal assistance;Patient able to independently direct caregiver Correct positioning of sling/immobilizer: Maximal assistance;Patient able to independently direct caregiver;Caregiver independent with task ROM for elbow, wrist and digits of operated UE: Supervision/safety Sling wearing schedule (on at all times/off for  ADL's): Modified independent Proper positioning of operated UE when showering: Moderate assistance;Patient able to independently direct caregiver Positioning of UE while sleeping: Moderate assistance;Patient able to independently direct caregiver     General Comments      Pertinent Vitals/ Pain       Pain Assessment: 0-10 Pain Score: 9  Pain Location: R shoulder Pain Descriptors / Indicators: Sore;Sharp;Operative site guarding;Grimacing Pain Intervention(s): Monitored during session;Repositioned;Ice applied;RN gave pain meds during session  Home Living Family/patient expects to be discharged to:: Private residence Living Arrangements: Alone Available Help at Discharge: Family;Available 24 hours/day Type of Home: House Home Access: Stairs to enter [employer] of Steps: 2 Entrance Stairs-Rails: Left Home Layout: One level     Bathroom Shower/Tub: Producer, television/film/video: Handicapped height     Home Equipment: Shower seat - built in;Hand held shower head          Prior Functioning/Environment Level of Independence: Independent            Frequency  Min 3X/week        Progress Toward Goals  OT Goals(current goals can now be found in the care plan section)  Progress towards OT goals: Progressing toward goals  Acute Rehab OT Goals Patient Stated Goal: to be safe at home OT Goal Formulation: With patient Time For Goal Achievement: 03/28/16 Potential to Achieve Goals: Good ADL Goals Pt Will Perform Upper Body Dressing: with min assist;with caregiver independent in assisting (donning RUE first; shirt under sling) Pt Will Transfer to Toilet: with modified independence;ambulating Pt/caregiver will Perform Home Exercise Program: Right Upper extremity;With written HEP provided Additional ADL Goal #1: Pt will demonstrate OT taught method of bathing under RUE arm.  Plan Discharge plan remains appropriate    Co-evaluation  End of Session Equipment Utilized During Treatment: Gait belt;Other (comment) (sling)   Activity Tolerance Patient tolerated treatment well   Patient Left in chair;with call bell/phone within reach;with nursing/sitter in room   Nurse Communication Mobility status;Weight bearing status;Other (comment) (in room administering pain medicine)        Time: 4098-1191 OT Time Calculation (min): 38 min  Charges: OT General Charges $OT Visit: 1 Procedure OT Evaluation $OT Eval Moderate Complexity: 1 Procedure OT Treatments $Self Care/Home Management : 8-22 mins $Therapeutic Exercise: 23-37 mins  Emelda Fear 03/21/2016, 5:02 PM  Sherryl Manges OTR/L [phone]

## 2016-03-21 NOTE — Evaluation (Signed)
Occupational Therapy Evaluation Patient Details Name: Charles Allison MRN: [id number] DOB: 01/10/1946 Today's Date: 03/21/2016    History of Present Illness 71 y/o male s/p REVERSE SHOULDER ARTHROPLASTY (Right) DePuy Delta Xtend. Pt has a past medical history including but not limited to Arthritis; Depression; Diabetes mellitus without complication; Dyspnea; cardiomegaly; Hypertension; and Sleep apnea.  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   Clinical Impression   PTA Pt independent in ADL and mobility. Currently max A for ADL and min A for mobility. Please see performance level below. Pt will benefit from skilled OT in the acute care setting to maximize independence and safety in ADL/HEP, and transfers. Next session to focus on HEP with Pt and reinforcing education from handout.     Follow Up Recommendations  Supervision/Assistance - 24 hour (initially; progress rehab of shoulder as ordered by MD)    Equipment Recommendations  None recommended by OT    Recommendations for Other Services       Precautions / Restrictions Precautions Precautions: Shoulder Type of Shoulder Precautions: Active Protocol Shoulder Interventions: Shoulder sling/immobilizer;For comfort (and sleep) Precaution Booklet Issued: Yes (comment) Precaution Comments: OT shoulder protocol handout reviewed in full Required Braces or Orthoses: Sling Restrictions Weight Bearing Restrictions: Yes RUE Weight Bearing: Non weight bearing      Mobility Bed Mobility               General bed mobility comments: Pt sitting OOB in recliner when OT entered the room  Transfers Overall transfer level: Needs assistance   Transfers: Sit to/from Stand Sit to Stand: Min assist         General transfer comment: with sling on, no attempt to use RUE    Balance Overall balance assessment: Needs assistance Sitting-balance support: No upper  extremity supported;Feet supported Sitting balance-Leahy Scale: Good     Standing balance support: Single extremity supported;During functional activity Standing balance-Leahy Scale: Fair                              ADL Overall ADL's : Needs assistance/impaired Eating/Feeding: Moderate assistance;With caregiver independent assisting;Bed level   Grooming: Wash/dry hands;Wash/dry face;Set up;Sitting Grooming Details (indicate cue type and reason): sitting in recliner                 Toilet Transfer: Minimal assistance;Comfort height toilet Toilet Transfer Details (indicate cue type and reason): simulated with recliner           General ADL Comments: Please see shoulder section below for more detailed information     Vision Vision Assessment?: No apparent visual deficits   Perception     Praxis      Pertinent Vitals/Pain Pain Assessment: 0-10 Pain Score: 7  Pain Location: R shoulder Pain Descriptors / Indicators: Sore;Sharp;Tightness;Grimacing;Operative site guarding Pain Intervention(s): Monitored during session;Repositioned     Hand Dominance Right   Extremity/Trunk Assessment Upper Extremity Assessment Upper Extremity Assessment: RUE deficits/detail RUE Deficits / Details: post-op deficits in strength and ROM RUE: Unable to fully assess due to pain;Unable to fully assess due to immobilization RUE Sensation:  (Pt states that anterior side of forearm is hypersensitive)   Lower Extremity Assessment Lower Extremity Assessment: Overall WFL for tasks assessed (history of BLE TKA)   Cervical / Trunk Assessment Cervical / Trunk Assessment: Normal   Communication Communication Communication: HOH (wears hearing aids)   Cognition Arousal/Alertness: Suspect due to medications  Behavior During Therapy: WFL for tasks assessed/performed Overall Cognitive Status: Within Functional Limits for tasks assessed                 General Comments: Pt  more alert than this morning, and able to recall some precautions/education when asked but still impacted by medicine - suddenly changing topics of conversation and sister/mother confirm that he is not normally this way   General Comments       Exercises Exercises: Shoulder     Shoulder Instructions Shoulder Instructions Donning/doffing shirt without moving shoulder: Maximal assistance Method for sponge bathing under operated UE: Minimal assistance Donning/doffing sling/immobilizer: Maximal assistance;Patient able to independently direct caregiver Correct positioning of sling/immobilizer: Maximal assistance;Patient able to independently direct caregiver;Caregiver independent with task ROM for elbow, wrist and digits of operated UE: Supervision/safety Sling wearing schedule (on at all times/off for ADL's): Modified independent Proper positioning of operated UE when showering: Moderate assistance;Patient able to independently direct caregiver Positioning of UE while sleeping: Moderate assistance;Patient able to independently direct caregiver    Home Living Family/patient expects to be discharged to:: Private residence Living Arrangements: Alone Available Help at Discharge: Family;Available 24 hours/day Type of Home: House Home Access: Stairs to enter [employer]Entrance Stairs-Number of Steps: 2 Entrance Stairs-Rails: Left Home Layout: One level     Bathroom Shower/Tub: Producer, television/film/videoWalk-in shower   Bathroom Toilet: Handicapped height     Home Equipment: Shower seat - built in;Hand held shower head          Prior Functioning/Environment Level of Independence: Independent                 OT Problem List: Decreased strength;Decreased range of motion;Decreased activity tolerance;Impaired balance (sitting and/or standing);Decreased safety awareness;Decreased knowledge of use of DME or AE;Decreased knowledge of precautions;Impaired UE functional use;Pain   OT Treatment/Interventions:  Self-care/ADL training;Therapeutic exercise;Therapeutic activities;Patient/family education;Balance training;DME and/or AE instruction    OT Goals(Current goals can be found in the care plan section) Acute Rehab OT Goals Patient Stated Goal: to be safe at home OT Goal Formulation: With patient Time For Goal Achievement: 03/28/16 Potential to Achieve Goals: Good ADL Goals Pt Will Perform Upper Body Dressing: with min assist;with caregiver independent in assisting (donning RUE first; shirt under sling) Pt Will Transfer to Toilet: with modified independence;ambulating Pt/caregiver will Perform Home Exercise Program: Right Upper extremity;With written HEP provided Additional ADL Goal #1: Pt will demonstrate OT taught method of bathing under RUE arm.  OT Frequency: Min 3X/week   Barriers to D/C: Decreased caregiver support  Pt lives alone - but planning on staying with mother for a week.       Co-evaluation              End of Session Equipment Utilized During Treatment: Gait belt;Other (comment) (sling) Nurse Communication: Mobility status;Other (comment) (Pt requires another night stay to retain education)  Activity Tolerance: Patient tolerated treatment well Patient left: in chair;with call bell/phone within reach;with family/visitor present   Time: [id number] OT Time Calculation (min): 45 min Charges:  OT General Charges $OT Visit: 1 Procedure OT Evaluation $OT Eval Moderate Complexity: 1 Procedure OT Treatments $Self Care/Home Management : 8-22 mins $Therapeutic Exercise: 8-22 mins G-Codes:    Evern BioLaura J Brody Kump 03/21/2016, 2:37 PM Sherryl MangesLaura Daylene Vandenbosch OTR/L [phone]

## 2016-03-21 NOTE — Progress Notes (Signed)
Orthopedics Progress Note  Subjective: Patient super sleepy this morning after pain medication  Objective:  Vitals:   03/20/16 2205 03/21/16 0455  BP: (!) 148/61 (!) 113/54  Pulse: 61 82  Resp:    Temp: 99.5 F (37.5 C) 99 F (37.2 C)    General: Awake and alert  Musculoskeletal: right shoulder dressing CDI, NVI Neurovascularly intact  Lab Results  Component Value Date   WBC 7.0 03/18/2016   HGB 12.2 (L) 03/21/2016   HCT 38.2 (L) 03/21/2016   MCV 87.5 03/18/2016   PLT 165 03/18/2016       Component Value Date/Time   NA 136 03/21/2016 0404   NA 138 04/19/2013 0734   K 4.8 03/21/2016 0404   K 3.7 04/19/2013 0734   CL 102 03/21/2016 0404   CL 102 04/19/2013 0734   CO2 26 03/21/2016 0404   CO2 28 04/19/2013 0734   GLUCOSE 217 (H) 03/21/2016 0404   GLUCOSE 215 (H) 04/19/2013 0734   BUN 19 03/21/2016 0404   BUN 20 (H) 04/19/2013 0734   CREATININE 1.33 (H) 03/21/2016 0404   CREATININE 1.37 (H) 04/19/2013 0734   CALCIUM 8.6 (L) 03/21/2016 0404   CALCIUM 9.5 04/19/2013 0734   GFRNONAA 53 (L) 03/21/2016 0404   GFRNONAA 53 (L) 04/19/2013 0734   GFRAA >60 03/21/2016 0404   GFRAA >60 04/19/2013 0734    Lab Results  Component Value Date   INR 1.04 02/14/2012   INR 1.0 05/14/2007    Assessment/Plan: POD #1  s/p Procedure(s): REVERSE SHOULDER ARTHROPLASTY Patient refused CPAP last night.  No pulse ox on this morning.  Patient is able to be aroused but in my opinion is too sedated.  Will change pain meds. OT Possible discharge later today.  Almedia BallsSteven R. Ranell PatrickNorris, MD 03/21/2016 8:10 AM

## 2016-03-21 NOTE — Progress Notes (Signed)
OT Cancellation Note  Patient Details Name: Charles Allison MRN: [id number] DOB: 26-May-1945   Cancelled Treatment:    Reason Eval/Treat Not Completed: Medical issues which prohibited therapy;Other (comment) (Pt not able to maintain conversation/retain education). Pt's sister and mother provided with shoulder handout and asked to review for questions. OT to return later to review handout, and go over HEP with Pt and caregivers.  Evern BioLaura J Anael Rosch 03/21/2016, 9:21 AM  Sherryl MangesLaura Sania Noy OTR/L [phone]

## 2016-03-22 LAB — GLUCOSE, CAPILLARY
Glucose-Capillary: 168 mg/dL — ABNORMAL HIGH (ref 65–99)
Glucose-Capillary: 196 mg/dL — ABNORMAL HIGH (ref 65–99)
Glucose-Capillary: 207 mg/dL — ABNORMAL HIGH (ref 65–99)

## 2016-03-22 NOTE — Progress Notes (Signed)
Orthopedics Progress Note  Subjective: Patient was up out of bed a lot yesterday.  Doing better this morning.  Used CPAP last night  Objective:  Vitals:   03/22/16 0000 03/22/16 0645  BP:  (!) 149/62  Pulse: 70 70  Resp: 16 16  Temp:  98.2 F (36.8 C)    General: Awake and alert  Musculoskeletal: right shoulder dressing changed to Aquacel  No erythema Neurovascularly intact  Lab Results  Component Value Date   WBC 7.0 03/18/2016   HGB 12.2 (L) 03/21/2016   HCT 38.2 (L) 03/21/2016   MCV 87.5 03/18/2016   PLT 165 03/18/2016       Component Value Date/Time   NA 136 03/21/2016 0404   NA 138 04/19/2013 0734   K 4.8 03/21/2016 0404   K 3.7 04/19/2013 0734   CL 102 03/21/2016 0404   CL 102 04/19/2013 0734   CO2 26 03/21/2016 0404   CO2 28 04/19/2013 0734   GLUCOSE 217 (H) 03/21/2016 0404   GLUCOSE 215 (H) 04/19/2013 0734   BUN 19 03/21/2016 0404   BUN 20 (H) 04/19/2013 0734   CREATININE 1.33 (H) 03/21/2016 0404   CREATININE 1.37 (H) 04/19/2013 0734   CALCIUM 8.6 (L) 03/21/2016 0404   CALCIUM 9.5 04/19/2013 0734   GFRNONAA 53 (L) 03/21/2016 0404   GFRNONAA 53 (L) 04/19/2013 0734   GFRAA >60 03/21/2016 0404   GFRAA >60 04/19/2013 0734    Lab Results  Component Value Date   INR 1.04 02/14/2012   INR 1.0 05/14/2007    Assessment/Plan: POD #2 s/p Procedure(s): REVERSE SHOULDER ARTHROPLASTY OT, mobilization D/C today  Almedia BallsSteven R. Ranell PatrickNorris, MD 03/22/2016 9:29 AM

## 2016-03-22 NOTE — Discharge Summary (Signed)
Physician Discharge Summary   Patient ID: Charles Allison MRN: [id number] DOB/AGE: [birth year] 71 y.o.  Admit date: 03/20/2016 Discharge date: 03/22/2016  Admission Diagnoses:  Active Problems:   S/P shoulder replacement, right   Discharge Diagnoses:  Same   Surgeries: Procedure(s): REVERSE SHOULDER ARTHROPLASTY on 03/20/2016   Consultants: OT  Discharged Condition: Stable  Hospital Course: Charles Allison is an 71 y.o. 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  They were brought to the operating room on 03/20/2016 and underwent the above named procedures.    The patient had an uncomplicated hospital course and was stable for discharge.  Recent vital signs:  Vitals:   03/22/16 0000 03/22/16 0645  BP:  (!) 149/62  Pulse: 70 70  Resp: 16 16  Temp:  98.2 F (36.8 C)    Recent laboratory studies:  Results for orders placed or performed during the hospital encounter of 03/20/16  Glucose, capillary  Result Value Ref Range   Glucose-Capillary 171 (H) 65 - 99 mg/dL  Glucose, capillary  Result Value Ref Range   Glucose-Capillary 158 (H) 65 - 99 mg/dL  Hemoglobin and hematocrit, blood  Result Value Ref Range   Hemoglobin 12.2 (L) 13.0 - 17.0 g/dL   HCT 40.938.2 (L) 81.139.0 - 91.452.0 %  Basic metabolic panel  Result Value Ref Range   Sodium 136 135 - 145 mmol/L   Potassium 4.8 3.5 - 5.1 mmol/L   Chloride 102 101 - 111 mmol/L   CO2 26 22 - 32 mmol/L   Glucose, Bld 217 (H) 65 - 99 mg/dL   BUN 19 6 - 20 mg/dL   Creatinine, Ser 7.821.33 (H) 0.61 - 1.24 mg/dL   Calcium 8.6 (L) 8.9 - 10.3 mg/dL   GFR calc non Af Amer 53 (L) >60 mL/min   GFR calc Af Amer >60 >60 mL/min   Anion gap 8 5 - 15  Glucose, capillary  Result Value Ref Range   Glucose-Capillary 169 (H) 65 - 99 mg/dL   Comment 1 Repeat Test    Comment 2 Document in Chart   Glucose, capillary  Result Value  Ref Range   Glucose-Capillary 120 (H) 65 - 99 mg/dL  Glucose, capillary  Result Value Ref Range   Glucose-Capillary 271 (H) 65 - 99 mg/dL  Glucose, capillary  Result Value Ref Range   Glucose-Capillary 140 (H) 65 - 99 mg/dL  Glucose, capillary  Result Value Ref Range   Glucose-Capillary 168 (H) 65 - 99 mg/dL  Glucose, capillary  Result Value Ref Range   Glucose-Capillary 196 (H) 65 - 99 mg/dL    Discharge Medications:   Allergies as of 03/22/2016   No Known Allergies     Medication List    TAKE these medications   aspirin EC 81 MG tablet Take 81 mg by mouth daily.   B-12 1000 MCG Lozg Take 1 lozenge by mouth daily.   clonazePAM 0.5 MG tablet Commonly known as:  KLONOPIN Take 0.5 mg by mouth at bedtime.   donepezil 10 MG tablet Commonly known as:  ARICEPT Take 10 mg by mouth at bedtime.   Flaxseed Oil 1000 MG Caps Take 1 capsule by mouth 2 (two) times daily.   FLUoxetine 20 MG capsule Commonly known as:  PROZAC Take 60 mg by mouth daily.   glipiZIDE 10 MG tablet Commonly known as:  GLUCOTROL Take 10-20 mg by  mouth 2 (two) times daily before a meal. 2 tabs in the am, 1 tab in the evening   levothyroxine 50 MCG tablet Commonly known as:  SYNTHROID, LEVOTHROID Take 50 mcg by mouth daily before breakfast.   losartan 50 MG tablet Commonly known as:  COZAAR Take 25 mg by mouth daily.   metFORMIN 1000 MG tablet Commonly known as:  GLUCOPHAGE Take 1,000 mg by mouth 2 (two) times daily with a meal.   methocarbamol 500 MG tablet Commonly known as:  ROBAXIN Take 1 tablet (500 mg total) by mouth 3 (three) times daily as needed.   metoprolol 50 MG tablet Commonly known as:  LOPRESSOR Take 25 mg by mouth 2 (two) times daily.   NUTRITIONAL SUPPLEMENTS PO Take 3 capsules by mouth daily. SUPER GREENS   NUTRITIONAL SUPPLEMENTS PO Take 3 capsules by mouth daily. BOOST For Focus and Energy   NUTRITIONAL SUPPLEMENTS PO Take 1 capsule by mouth 2 (two) times daily.  WELL SPRING   omeprazole 20 MG capsule Commonly known as:  PRILOSEC Take 20 mg by mouth daily.   oxyCODONE-acetaminophen 5-325 MG tablet Commonly known as:  ROXICET Take 1-2 tablets by mouth every 4 (four) hours as needed for severe pain.   pioglitazone 45 MG tablet Commonly known as:  ACTOS Take 45 mg by mouth daily.   pravastatin 20 MG tablet Commonly known as:  PRAVACHOL Take 60 mg by mouth at bedtime.   ranitidine 300 MG tablet Commonly known as:  ZANTAC Take 300 mg by mouth at bedtime.   tamsulosin 0.4 MG Caps capsule Commonly known as:  FLOMAX Take 0.4 mg by mouth at bedtime.   traZODone 100 MG tablet Commonly known as:  DESYREL Take 100 mg by mouth at bedtime.       Diagnostic Studies: Dg Shoulder Right Port  Result Date: 03/20/2016 CLINICAL DATA:  Post right shoulder replacement EXAM: PORTABLE RIGHT SHOULDER COMPARISON:  None. FINDINGS: Post shoulder replacement without evidence of fracture or dislocation given solitary AP projection. Minimal amount of expected intra-articular air in adjacent soft tissue swelling. No radiopaque foreign body. Limited visualization adjacent thorax is normal. IMPRESSION: Post right total shoulder replacement without evidence of complication Electronically Signed   By: Simonne Come M.D.   On: 03/20/2016 14:32    Disposition: 01-Home or Self Care  Discharge Instructions    Call MD / Call 911    Complete by:  As directed    If you experience chest pain or shortness of breath, CALL 911 and be transported to the hospital emergency room.  If you develope a fever above 101 F, pus (white drainage) or increased drainage or redness at the wound, or calf pain, call your surgeon's office.   Constipation Prevention    Complete by:  As directed    Drink plenty of fluids.  Prune juice may be helpful.  You may use a stool softener, such as Colace (over the counter) 100 mg twice a day.  Use MiraLax (over the counter) for constipation as needed.    Diet - low sodium heart healthy    Complete by:  As directed    Driving restrictions    Complete by:  As directed    No driving for 3 weeks   Increase activity slowly as tolerated    Complete by:  As directed    Lifting restrictions    Complete by:  As directed    No lifting over 2 pounds for 10 weeks  Follow-up Information    Patricia Perales,STEVEN R, MD. Call in 2 weeks.   Specialty:  Orthopedic Surgery Why:  [phone] Contact information: [address] [place] [address] [phone]            Signed: Verlee Rossetti 03/22/2016, 9:33 AM `````````````````````````````````````````````````````````````

## 2016-03-22 NOTE — Progress Notes (Signed)
Occupational Therapy Treatment Patient Details Name: Charles Allison MRN: [id number] DOB: 1945/12/24 Today's Date: 03/22/2016    History of present illness 71 y/o male s/p REVERSE SHOULDER ARTHROPLASTY (Right) DePuy Delta Xtend. Pt has a past medical history including but not limited to Arthritis; Depression; Diabetes mellitus without complication; Dyspnea; cardiomegaly; Hypertension; and Sleep apnea.  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   OT comments  Pt making progress towards goals this session. Pt able to complete HEP with OT and shoulder handout reviewed in full. Pt especially focused on showering in addition to HEP. Pt at adequate level for dc with family support from OT perspective.  Follow Up Recommendations  Supervision/Assistance - 24 hour (initially)    Equipment Recommendations  None recommended by OT    Recommendations for Other Services      Precautions / Restrictions Precautions Precautions: Shoulder Type of Shoulder Precautions: Active Protocol Shoulder Interventions: Shoulder sling/immobilizer;For comfort Precaution Booklet Issued: Yes (comment) Precaution Comments: OT shoulder protocol handout and HEP reviewed in full Required Braces or Orthoses: Sling Restrictions Weight Bearing Restrictions: Yes RUE Weight Bearing: Non weight bearing       Mobility Bed Mobility               General bed mobility comments: Pt sitting OOB in recliner when OT entered the room  Transfers Overall transfer level: Needs assistance Equipment used: None Transfers: Sit to/from Stand Sit to Stand: Min guard         General transfer comment: with sling on, no attempt to use RUE    Balance Overall balance assessment: Needs assistance Sitting-balance support: No upper extremity supported;Feet supported Sitting balance-Leahy Scale: Good     Standing balance support: No upper extremity  supported;During functional activity Standing balance-Leahy Scale: Fair                     ADL Overall ADL's : Needs assistance/impaired     Grooming: Wash/dry face;Standing;Min guard Grooming Details (indicate cue type and reason): min guard for safety     Lower Body Bathing: Supervison/ safety;Sit to/from stand Lower Body Bathing Details (indicate cue type and reason): Pt able to clean private area standing     Lower Body Dressing: Moderate assistance;Sit to/from stand Lower Body Dressing Details (indicate cue type and reason): OT assist to doff underwear Toilet Transfer: Min guard;Comfort height toilet Toilet Transfer Details (indicate cue type and reason): simulated with recliner         Functional mobility during ADLs: Min guard General ADL Comments: Please see shoulder section below for more detailed information      Vision                     Perception     Praxis      Cognition   Behavior [facility] for tasks assessed/performed Overall Cognitive Status: Within Functional Limits for tasks assessed                  General Comments: Pt with 50% recall from previous session. Better retention this session.    Extremity/Trunk Assessment               Exercises Shoulder Exercises Shoulder Flexion: PROM;AROM;Right;10 reps;Supine (performed in recliner, Pt able to get to 60 degrees ) Shoulder ABduction: PROM;AROM;Right;10 reps;Seated (Pt able to get to 60 degrees) Shoulder External Rotation: PROM;AROM;Right;10 reps;Seated (Pt able to get to neutral) Elbow Flexion: AROM;Right;10  reps;Seated;Standing Elbow Extension: AROM;Right;10 reps;15 reps;Seated;Standing Wrist Flexion: AROM;Right Wrist Extension: AROM;Right Digit Composite Flexion: AROM;Right Neck Flexion: AROM Neck Extension: AROM Neck Lateral Flexion - Right: AROM Neck Lateral Flexion - Left: AROM Donning/doffing shirt without moving shoulder: Maximal  assistance Method for sponge bathing under operated UE: Minimal assistance Donning/doffing sling/immobilizer: Maximal assistance;Patient able to independently direct caregiver Correct positioning of sling/immobilizer: Maximal assistance;Patient able to independently direct caregiver;Caregiver independent with task ROM for elbow, wrist and digits of operated UE: Supervision/safety Sling wearing schedule (on at all times/off for ADL's): Modified independent Proper positioning of operated UE when showering: Moderate assistance;Patient able to independently direct caregiver Positioning of UE while sleeping: Moderate assistance;Patient able to independently direct caregiver   Shoulder Instructions Shoulder Instructions Donning/doffing shirt without moving shoulder: Maximal assistance Method for sponge bathing under operated UE: Minimal assistance Donning/doffing sling/immobilizer: Maximal assistance;Patient able to independently direct caregiver Correct positioning of sling/immobilizer: Maximal assistance;Patient able to independently direct caregiver;Caregiver independent with task ROM for elbow, wrist and digits of operated UE: Supervision/safety Sling wearing schedule (on at all times/off for ADL's): Modified independent Proper positioning of operated UE when showering: Moderate assistance;Patient able to independently direct caregiver Positioning of UE while sleeping: Moderate assistance;Patient able to independently direct caregiver     General Comments      Pertinent Vitals/ Pain       Pain Assessment: 0-10 Pain Score: 5  Pain Location: R shoulder Pain Descriptors / Indicators: Sore;Sharp;Operative site guarding;Grimacing Pain Intervention(s): Monitored during session;Repositioned;Ice applied;Premedicated before session  Home Living                                          Prior Functioning/Environment              Frequency  Min 3X/week         Progress Toward Goals  OT Goals(current goals can now be found in the care plan section)  Progress towards OT goals: Progressing toward goals  Acute Rehab OT Goals Patient Stated Goal: to be safe at home OT Goal Formulation: With patient Time For Goal Achievement: 03/28/16 Potential to Achieve Goals: Good  Plan Discharge plan remains appropriate    Co-evaluation                 End of Session Equipment Utilized During Treatment: Other (comment) (sling)   Activity Tolerance Patient tolerated treatment well   Patient Left in bed;with call bell/phone within reach   Nurse Communication Mobility status;Weight bearing status (OT completed)        Time: 1610-9604 OT Time Calculation (min): 40 min  Charges: OT General Charges $OT Visit: 1 Procedure OT Treatments $Self Care/Home Management : 8-22 mins $Therapeutic Exercise: 23-37 mins  Evern Bio Athea Haley 03/22/2016, 11:41 AM Sherryl Manges OTR/L [phone]

## 2016-08-05 NOTE — Anesthesia Postprocedure Evaluation (Signed)
Anesthesia Post Note  Patient: Charles Allison  Procedure(s) Performed: Procedure(s) (LRB): REVERSE SHOULDER ARTHROPLASTY (Right)     Anesthesia Post Evaluation  Last Vitals:  Vitals:   03/22/16 0000 03/22/16 0645  BP:  (!) 149/62  Pulse: 70 70  Resp: 16 16  Temp:  36.8 C    Last Pain:  Vitals:   03/22/16 1100  TempSrc:   PainSc: Asleep                 [name] S

## 2016-08-05 NOTE — Addendum Note (Signed)
Addendum  created 08/05/16 1009 by Marivel Mcclarty, MD   Sign clinical note    

## 2018-10-09 DIAGNOSIS — N179 Acute kidney failure, unspecified: Secondary | ICD-10-CM

## 2018-10-09 DIAGNOSIS — B3749 Other urogenital candidiasis: Secondary | ICD-10-CM

## 2018-10-09 DIAGNOSIS — A0472 Enterocolitis due to Clostridium difficile, not specified as recurrent: Secondary | ICD-10-CM

## 2018-10-09 DIAGNOSIS — I10 Essential (primary) hypertension: Secondary | ICD-10-CM

## 2018-10-09 DIAGNOSIS — E1169 Type 2 diabetes mellitus with other specified complication: Secondary | ICD-10-CM

## 2018-10-10 DIAGNOSIS — A0472 Enterocolitis due to Clostridium difficile, not specified as recurrent: Secondary | ICD-10-CM | POA: Diagnosis not present

## 2018-10-10 DIAGNOSIS — E1169 Type 2 diabetes mellitus with other specified complication: Secondary | ICD-10-CM | POA: Diagnosis not present

## 2018-10-10 DIAGNOSIS — I10 Essential (primary) hypertension: Secondary | ICD-10-CM | POA: Diagnosis not present

## 2018-10-10 DIAGNOSIS — N179 Acute kidney failure, unspecified: Secondary | ICD-10-CM | POA: Diagnosis not present

## 2018-10-11 DIAGNOSIS — I10 Essential (primary) hypertension: Secondary | ICD-10-CM | POA: Diagnosis not present

## 2018-10-11 DIAGNOSIS — N179 Acute kidney failure, unspecified: Secondary | ICD-10-CM | POA: Diagnosis not present

## 2018-10-11 DIAGNOSIS — A0472 Enterocolitis due to Clostridium difficile, not specified as recurrent: Secondary | ICD-10-CM | POA: Diagnosis not present

## 2018-10-11 DIAGNOSIS — E1169 Type 2 diabetes mellitus with other specified complication: Secondary | ICD-10-CM | POA: Diagnosis not present

## 2018-10-12 DIAGNOSIS — I10 Essential (primary) hypertension: Secondary | ICD-10-CM | POA: Diagnosis not present

## 2018-10-12 DIAGNOSIS — A0472 Enterocolitis due to Clostridium difficile, not specified as recurrent: Secondary | ICD-10-CM | POA: Diagnosis not present

## 2018-10-12 DIAGNOSIS — N179 Acute kidney failure, unspecified: Secondary | ICD-10-CM | POA: Diagnosis not present

## 2018-10-12 DIAGNOSIS — E1169 Type 2 diabetes mellitus with other specified complication: Secondary | ICD-10-CM | POA: Diagnosis not present

## 2018-10-13 DIAGNOSIS — A0472 Enterocolitis due to Clostridium difficile, not specified as recurrent: Secondary | ICD-10-CM | POA: Diagnosis not present

## 2018-10-13 DIAGNOSIS — N179 Acute kidney failure, unspecified: Secondary | ICD-10-CM | POA: Diagnosis not present

## 2018-10-13 DIAGNOSIS — E1169 Type 2 diabetes mellitus with other specified complication: Secondary | ICD-10-CM | POA: Diagnosis not present

## 2018-10-13 DIAGNOSIS — I10 Essential (primary) hypertension: Secondary | ICD-10-CM | POA: Diagnosis not present

## 2018-10-17 DIAGNOSIS — W19XXXA Unspecified fall, initial encounter: Secondary | ICD-10-CM | POA: Diagnosis not present

## 2018-10-17 DIAGNOSIS — R531 Weakness: Secondary | ICD-10-CM | POA: Diagnosis not present

## 2018-10-17 DIAGNOSIS — I160 Hypertensive urgency: Secondary | ICD-10-CM | POA: Diagnosis not present

## 2018-10-18 DIAGNOSIS — I10 Essential (primary) hypertension: Secondary | ICD-10-CM | POA: Diagnosis not present

## 2018-10-18 DIAGNOSIS — A0472 Enterocolitis due to Clostridium difficile, not specified as recurrent: Secondary | ICD-10-CM

## 2018-10-18 DIAGNOSIS — I160 Hypertensive urgency: Secondary | ICD-10-CM | POA: Diagnosis not present

## 2018-10-18 DIAGNOSIS — A045 Campylobacter enteritis: Secondary | ICD-10-CM

## 2018-10-18 DIAGNOSIS — E1169 Type 2 diabetes mellitus with other specified complication: Secondary | ICD-10-CM

## 2018-10-18 DIAGNOSIS — N179 Acute kidney failure, unspecified: Secondary | ICD-10-CM | POA: Diagnosis not present

## 2018-10-18 DIAGNOSIS — G610 Guillain-Barre syndrome: Secondary | ICD-10-CM | POA: Diagnosis not present

## 2018-10-19 DIAGNOSIS — I160 Hypertensive urgency: Secondary | ICD-10-CM | POA: Diagnosis not present

## 2018-10-19 DIAGNOSIS — N179 Acute kidney failure, unspecified: Secondary | ICD-10-CM | POA: Diagnosis not present

## 2018-10-19 DIAGNOSIS — I10 Essential (primary) hypertension: Secondary | ICD-10-CM | POA: Diagnosis not present

## 2018-10-19 DIAGNOSIS — G610 Guillain-Barre syndrome: Secondary | ICD-10-CM | POA: Diagnosis not present

## 2018-10-20 DIAGNOSIS — I160 Hypertensive urgency: Secondary | ICD-10-CM | POA: Diagnosis not present

## 2018-10-20 DIAGNOSIS — I10 Essential (primary) hypertension: Secondary | ICD-10-CM | POA: Diagnosis not present

## 2018-10-20 DIAGNOSIS — G610 Guillain-Barre syndrome: Secondary | ICD-10-CM | POA: Diagnosis not present

## 2018-10-20 DIAGNOSIS — N179 Acute kidney failure, unspecified: Secondary | ICD-10-CM | POA: Diagnosis not present

## 2018-10-21 DIAGNOSIS — G610 Guillain-Barre syndrome: Secondary | ICD-10-CM | POA: Diagnosis not present

## 2018-10-21 DIAGNOSIS — I10 Essential (primary) hypertension: Secondary | ICD-10-CM | POA: Diagnosis not present

## 2018-10-21 DIAGNOSIS — N179 Acute kidney failure, unspecified: Secondary | ICD-10-CM | POA: Diagnosis not present

## 2018-10-21 DIAGNOSIS — I160 Hypertensive urgency: Secondary | ICD-10-CM | POA: Diagnosis not present

## 2018-10-22 DIAGNOSIS — I160 Hypertensive urgency: Secondary | ICD-10-CM | POA: Diagnosis not present

## 2018-10-22 DIAGNOSIS — I10 Essential (primary) hypertension: Secondary | ICD-10-CM | POA: Diagnosis not present

## 2018-10-22 DIAGNOSIS — G610 Guillain-Barre syndrome: Secondary | ICD-10-CM | POA: Diagnosis not present

## 2018-10-22 DIAGNOSIS — N179 Acute kidney failure, unspecified: Secondary | ICD-10-CM | POA: Diagnosis not present

## 2018-10-23 DIAGNOSIS — I10 Essential (primary) hypertension: Secondary | ICD-10-CM | POA: Diagnosis not present

## 2018-10-23 DIAGNOSIS — N179 Acute kidney failure, unspecified: Secondary | ICD-10-CM | POA: Diagnosis not present

## 2018-10-23 DIAGNOSIS — I160 Hypertensive urgency: Secondary | ICD-10-CM | POA: Diagnosis not present

## 2018-10-23 DIAGNOSIS — G610 Guillain-Barre syndrome: Secondary | ICD-10-CM | POA: Diagnosis not present

## 2018-10-24 DIAGNOSIS — I10 Essential (primary) hypertension: Secondary | ICD-10-CM | POA: Diagnosis not present

## 2018-10-24 DIAGNOSIS — G610 Guillain-Barre syndrome: Secondary | ICD-10-CM | POA: Diagnosis not present

## 2018-10-24 DIAGNOSIS — N179 Acute kidney failure, unspecified: Secondary | ICD-10-CM | POA: Diagnosis not present

## 2018-10-24 DIAGNOSIS — I160 Hypertensive urgency: Secondary | ICD-10-CM | POA: Diagnosis not present

## 2019-07-24 ENCOUNTER — Other Ambulatory Visit: Payer: Self-pay

## 2019-07-24 ENCOUNTER — Encounter: Payer: Self-pay | Admitting: Emergency Medicine

## 2019-07-24 ENCOUNTER — Emergency Department: Payer: No Typology Code available for payment source

## 2019-07-24 ENCOUNTER — Emergency Department
Admission: EM | Admit: 2019-07-24 | Discharge: 2019-07-25 | Disposition: A | Discharge status: Home / Self Care | Payer: No Typology Code available for payment source | Attending: Emergency Medicine | Admitting: Emergency Medicine

## 2019-07-24 DIAGNOSIS — K5900 Constipation, unspecified: Secondary | ICD-10-CM | POA: Insufficient documentation

## 2019-07-24 DIAGNOSIS — R1084 Generalized abdominal pain: Secondary | ICD-10-CM | POA: Diagnosis not present

## 2019-07-24 DIAGNOSIS — Z7982 Long term (current) use of aspirin: Secondary | ICD-10-CM | POA: Insufficient documentation

## 2019-07-24 DIAGNOSIS — E119 Type 2 diabetes mellitus without complications: Secondary | ICD-10-CM | POA: Diagnosis not present

## 2019-07-24 DIAGNOSIS — K859 Acute pancreatitis without necrosis or infection, unspecified: Secondary | ICD-10-CM | POA: Diagnosis not present

## 2019-07-24 DIAGNOSIS — Z87891 Personal history of nicotine dependence: Secondary | ICD-10-CM | POA: Diagnosis not present

## 2019-07-24 DIAGNOSIS — E039 Hypothyroidism, unspecified: Secondary | ICD-10-CM | POA: Diagnosis not present

## 2019-07-24 DIAGNOSIS — Z7984 Long term (current) use of oral hypoglycemic drugs: Secondary | ICD-10-CM | POA: Insufficient documentation

## 2019-07-24 DIAGNOSIS — Z96651 Presence of right artificial knee joint: Secondary | ICD-10-CM | POA: Insufficient documentation

## 2019-07-24 DIAGNOSIS — I10 Essential (primary) hypertension: Secondary | ICD-10-CM | POA: Insufficient documentation

## 2019-07-24 DIAGNOSIS — Z20822 Contact with and (suspected) exposure to covid-19: Secondary | ICD-10-CM | POA: Diagnosis not present

## 2019-07-24 DIAGNOSIS — Z79899 Other long term (current) drug therapy: Secondary | ICD-10-CM | POA: Insufficient documentation

## 2019-07-24 DIAGNOSIS — R509 Fever, unspecified: Secondary | ICD-10-CM | POA: Diagnosis not present

## 2019-07-24 DIAGNOSIS — R1031 Right lower quadrant pain: Secondary | ICD-10-CM | POA: Diagnosis present

## 2019-07-24 LAB — URINALYSIS, COMPLETE (UACMP) WITH MICROSCOPIC
Bacteria, UA: NONE SEEN
Bilirubin Urine: NEGATIVE
Glucose, UA: >=500 mg/dL — AB
Hgb urine dipstick: NEGATIVE
Ketones, ur: NEGATIVE mg/dL
Leukocytes,Ua: NEGATIVE
Nitrite: NEGATIVE
Protein, ur: NEGATIVE mg/dL
Specific Gravity, Urine: 1.025 (ref 1.005–1.030)
Squamous Epithelial / LPF: NONE SEEN (ref 0–5)
WBC, UA: NONE SEEN WBC/hpf (ref 0–5)
pH: 5 (ref 5.0–8.0)

## 2019-07-24 LAB — CBC
HCT: 47.9 % (ref 39.0–52.0)
Hemoglobin: 16 g/dL (ref 13.0–17.0)
MCH: 28.3 pg (ref 26.0–34.0)
MCHC: 33.4 g/dL (ref 30.0–36.0)
MCV: 84.6 fL (ref 80.0–100.0)
Platelets: 216 10*3/uL (ref 150–400)
RBC: 5.66 MIL/uL (ref 4.22–5.81)
RDW: 14.1 % (ref 11.5–15.5)
WBC: 10.9 10*3/uL — ABNORMAL HIGH (ref 4.0–10.5)
nRBC: 0 % (ref 0.0–0.2)

## 2019-07-24 LAB — COMPREHENSIVE METABOLIC PANEL
ALT: 36 U/L (ref 0–44)
AST: 32 U/L (ref 15–41)
Albumin: 4.8 g/dL (ref 3.5–5.0)
Alkaline Phosphatase: 59 U/L (ref 38–126)
Anion gap: 13 (ref 5–15)
BUN: 33 mg/dL — ABNORMAL HIGH (ref 8–23)
CO2: 26 mmol/L (ref 22–32)
Calcium: 9.8 mg/dL (ref 8.9–10.3)
Chloride: 99 mmol/L (ref 98–111)
Creatinine, Ser: 1.58 mg/dL — ABNORMAL HIGH (ref 0.61–1.24)
GFR calc Af Amer: 50 mL/min — ABNORMAL LOW (ref >60)
GFR calc non Af Amer: 43 mL/min — ABNORMAL LOW (ref >60)
Glucose, Bld: 308 mg/dL — ABNORMAL HIGH (ref 70–99)
Potassium: 4.4 mmol/L (ref 3.5–5.1)
Sodium: 138 mmol/L (ref 135–145)
Total Bilirubin: 0.8 mg/dL (ref 0.3–1.2)
Total Protein: 8 g/dL (ref 6.5–8.1)

## 2019-07-24 LAB — LIPASE, BLOOD: Lipase: 102 U/L — ABNORMAL HIGH (ref 11–51)

## 2019-07-24 LAB — LACTIC ACID, PLASMA
Lactic Acid, Venous: 2.9 mmol/L (ref 0.5–1.9)
Lactic Acid, Venous: 2.6 mmol/L (ref 0.5–1.9)

## 2019-07-24 MED ORDER — ONDANSETRON HCL 4 MG/2ML IJ SOLN
4.0000 mg | Freq: Once | INTRAMUSCULAR | Status: AC
  Administered 2019-07-24: 4 mg via INTRAVENOUS
  Filled 2019-07-24: qty 2

## 2019-07-24 MED ORDER — FENTANYL CITRATE (PF) 100 MCG/2ML IJ SOLN
50.0000 ug | INTRAMUSCULAR | Status: DC | PRN
  Administered 2019-07-24: 50 ug via INTRAVENOUS
  Filled 2019-07-24: qty 2

## 2019-07-24 MED ORDER — SODIUM CHLORIDE 0.9 % IV SOLN
2.0000 g | Freq: Once | INTRAVENOUS | Status: AC
  Administered 2019-07-24: 2 g via INTRAVENOUS
  Filled 2019-07-24: qty 20

## 2019-07-24 MED ORDER — METRONIDAZOLE IN NACL 5-0.79 MG/ML-% IV SOLN
500.0000 mg | Freq: Once | INTRAVENOUS | Status: AC
  Administered 2019-07-25: 500 mg via INTRAVENOUS
  Filled 2019-07-24: qty 100

## 2019-07-24 MED ORDER — LACTATED RINGERS IV BOLUS (SEPSIS): 1000.0000 mL | Freq: Once | INTRAVENOUS | Status: DC

## 2019-07-24 MED ORDER — MORPHINE SULFATE (PF) 4 MG/ML IV SOLN
4.0000 mg | Freq: Once | INTRAVENOUS | Status: AC
  Administered 2019-07-24: 4 mg via INTRAVENOUS
  Filled 2019-07-24: qty 1

## 2019-07-24 MED ORDER — LACTATED RINGERS IV BOLUS (SEPSIS): 500.0000 mL | Freq: Once | INTRAVENOUS | Status: DC

## 2019-07-24 MED ORDER — IOHEXOL 9 MG/ML PO SOLN
500.0000 mL | Freq: Two times a day (BID) | ORAL | Status: AC | PRN
  Administered 2019-07-24 (×2): 500 mL via ORAL

## 2019-07-24 MED ORDER — ONDANSETRON HCL 4 MG/2ML IJ SOLN
4.0000 mg | Freq: Once | INTRAMUSCULAR | Status: AC | PRN
  Administered 2019-07-24: 4 mg via INTRAVENOUS
  Filled 2019-07-24: qty 2

## 2019-07-24 NOTE — ED Notes (Signed)
Provider notified that pt is a hard stick and unable to obtain second IV. RN attempted 2 times. Provider stated no need to get second IV and that she will change LR to NS

## 2019-07-24 NOTE — ED Triage Notes (Signed)
Pt arrives POV to triage with c/o lower abdominal pain all day which became worse after dinner. Pt states that pain comes and goes and looks very uncomfortable at this time.

## 2019-07-24 NOTE — ED Provider Notes (Signed)
[facility] Emergency Department Provider Note   ____________________________________________   First MD Initiated Contact with Patient 07/24/19 2307     (approximate)  I have reviewed the triage vital signs and the nursing notes.   HISTORY  Chief Complaint Abdominal Pain    HPI Charles Allison is a 74 y.o. male who presents to the ED from home with a chief complaint of abdominal pain.  Patient reports right lower quadrant abdominal discomfort "all day".  Became worse after eating [place] food approximately 4 PM.  Denies fever, cough, chest pain, shortness of breath, nausea, vomiting, dysuria, diarrhea.  Does endorse loose stools last week.  Denies recent trauma, travel, or antibiotic use.       Past Medical History:  Diagnosis Date  . Arthritis   . BPH (benign prostatic hyperplasia)   . Depression   . Diabetes mellitus without complication (HCC)   . Dyspnea   . H/O hiatal hernia   . Hx of cardiomegaly   . [facility]  . Hypothyroidism   . Sleep apnea    bi pap    > 5 yrs  last sleep study    Patient Active Problem List   Diagnosis Date Noted  . 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 carpel tu     bil  . HERNIA REPAIR    . JOINT REPLACEMENT     lt shoulder, lt knee  . NEPHRECTOMY     partial  . REVERSE SHOULDER ARTHROPLASTY Right 03/20/2016   Procedure: REVERSE SHOULDER ARTHROPLASTY;  Surgeon: Beverely Low, MD;  Location: [facility] OR;  Service: Orthopedics;  Laterality: Right;  . SHOULDER SURGERY     LEFT  . TOTAL KNEE ARTHROPLASTY  02/17/2012   Procedure: TOTAL KNEE ARTHROPLASTY;  Surgeon: Dannielle Huh, MD;  Location: [facility] OR;  Service: Orthopedics;  Laterality: Right;  . TOTAL KNEE ARTHROPLASTY  02/17/2012   RIGHT     Prior to Admission medications   Medication Sig Start Date End Date Taking? Authorizing Provider  aspirin EC 81 MG tablet Take 81 mg  by mouth daily.   Yes [provider]  clonazePAM (KLONOPIN) 0.5 MG tablet Take 0.5 mg by mouth at bedtime.   Yes [provider]  Cyanocobalamin (B-12) 1000 MCG TBCR Take 5,000 mcg by mouth daily.    Yes [provider]  donepezil (ARICEPT) 10 MG tablet Take 10 mg by mouth at bedtime.   Yes [provider]  FLUoxetine (PROZAC) 20 MG capsule Take 60 mg by mouth daily.   Yes [provider]  glipiZIDE (GLUCOTROL) 10 MG tablet Take 10 mg by mouth 2 (two) times daily before a meal. 2 tabs in the am, 1 tab in the evening    Yes [provider]  losartan (COZAAR) 50 MG tablet Take 25 mg by mouth daily.   Yes [provider]  omeprazole (PRILOSEC) 20 MG capsule Take 20 mg by mouth 2 (two) times daily before a meal.    Yes [provider]  pravastatin (PRAVACHOL) 20 MG tablet Take 60 mg by mouth at bedtime.    Yes [provider]  tamsulosin (FLOMAX) 0.4 MG CAPS capsule Take 0.4 mg by mouth at bedtime.   Yes [provider]  traZODone (DESYREL) 100 MG tablet Take 100 mg by mouth at bedtime.   Yes [provider]  Flaxseed, Linseed, (  FLAXSEED OIL) 1000 MG CAPS Take 1 capsule by mouth 2 (two) times daily.    [provider]  lactulose (CHRONULAC) 10 GM/15ML solution Take 30 mLs (20 g total) by mouth daily as needed for mild constipation. 07/25/19   Irean Hong, MD  levothyroxine (SYNTHROID, LEVOTHROID) 50 MCG tablet Take 50 mcg by mouth daily before breakfast.    [provider]  metFORMIN (GLUCOPHAGE) 1000 MG tablet Take 1,000 mg by mouth 2 (two) times daily with a meal.    [provider]  methocarbamol (ROBAXIN) 500 MG tablet Take 1 tablet (500 mg total) by mouth 3 (three) times daily as needed. Patient not taking: Reported on 07/25/2019 03/20/16   Beverely Low, MD  metoprolol (LOPRESSOR) 50 MG tablet Take 25 mg by mouth 2 (two) times daily.    [provider]  NUTRITIONAL  SUPPLEMENTS PO Take 3 capsules by mouth daily. SUPER GREENS    [provider]  NUTRITIONAL SUPPLEMENTS PO Take 3 capsules by mouth daily. BOOST For Focus and Energy    [provider]  NUTRITIONAL SUPPLEMENTS PO Take 1 capsule by mouth 2 (two) times daily. WELL SPRING    [provider]  ondansetron (ZOFRAN ODT) 4 MG disintegrating tablet Take 1 tablet (4 mg total) by mouth every 8 (eight) hours as needed for nausea or vomiting. 07/25/19   Irean Hong, MD  oxyCODONE-acetaminophen (PERCOCET/ROXICET) 5-325 MG tablet Take 1 tablet by mouth every 4 (four) hours as needed for severe pain. 07/25/19   Irean Hong, MD  pioglitazone (ACTOS) 45 MG tablet Take 45 mg by mouth daily.    [provider]  ranitidine (ZANTAC) 300 MG tablet Take 300 mg by mouth at bedtime.    [provider]    Allergies Other  No family history on file.  Social History Social History   Tobacco Use  . Smoking status: Former Smoker    Quit date: 02/14/2000    Years since quitting: 19.4  . Smokeless tobacco: Never Used  Substance Use Topics  . Alcohol use: Yes    Comment: mo  . Drug use: No    Review of Systems  Constitutional: No fever/chills Eyes: No visual changes. ENT: No sore throat. Cardiovascular: Denies chest pain. Respiratory: Denies shortness of breath. Gastrointestinal: Positive for abdominal pain.  No nausea, no vomiting.  No diarrhea.  No constipation. Genitourinary: Negative for dysuria. Musculoskeletal: Negative for back pain. Skin: Negative for rash. Neurological: Negative for headaches, focal weakness or numbness.   ____________________________________________   PHYSICAL EXAM:  VITAL SIGNS: ED Triage Vitals [07/24/19 2114]  Enc Vitals Group     BP 122/78     Pulse Rate 74     Resp 16     Temp (!) 100.4 F (38 C)     Temp Source Oral     SpO2 96 %     Weight 225 lb (102.1 kg)     Height 5' 10.5" (1.791 m)     Head Circumference       Peak Flow      Pain Score 8     Pain Loc      Pain Edu?      Excl. in GC?     Constitutional: Alert and oriented.  Uncomfortable appearing and in mild acute distress. Eyes: Conjunctivae are normal. PERRL. EOMI. Head: Atraumatic. Nose: No congestion/rhinnorhea. Mouth/Throat: Mucous membranes are moist.   Neck: No stridor.  Supple neck without meningismus. Cardiovascular: Normal rate, regular  rhythm. Grossly normal heart sounds.  Good peripheral circulation. Respiratory: Normal respiratory effort.  No retractions. Lungs CTAB. Gastrointestinal: Soft and tender to palpation right upper, right lower and left lower quadrants, maximally in right lower quadrant without rebound or guarding. No distention. No abdominal bruits. No CVA tenderness. Musculoskeletal: No lower extremity tenderness nor edema.  No joint effusions. Neurologic:  Normal speech and language. No gross focal neurologic deficits are appreciated. No gait instability. Skin:  Skin is warm, dry and intact. No rash noted. Psychiatric: Mood and affect are normal. Speech and behavior are normal.  ____________________________________________   LABS (all labs ordered are listed, but only abnormal results are displayed)  Labs Reviewed  LIPASE, BLOOD - Abnormal; Notable for the following components:      Result Value   Lipase 102 (*)    All other components within normal limits  COMPREHENSIVE METABOLIC PANEL - Abnormal; Notable for the following components:   Glucose, Bld 308 (*)    BUN 33 (*)    Creatinine, Ser 1.58 (*)    GFR calc non Af Amer 43 (*)    GFR calc Af Amer 50 (*)    All other components within normal limits  CBC - Abnormal; Notable for the following components:   WBC 10.9 (*)    All other components within normal limits  URINALYSIS, COMPLETE (UACMP) WITH MICROSCOPIC - Abnormal; Notable for the following components:   Color, Urine YELLOW (*)    APPearance HAZY (*)    Glucose, UA >=500 (*)    All other  components within normal limits  LACTIC ACID, PLASMA - Abnormal; Notable for the following components:   Lactic Acid, Venous 2.9 (*)    All other components within normal limits  LACTIC ACID, PLASMA - Abnormal; Notable for the following components:   Lactic Acid, Venous 2.6 (*)    All other components within normal limits  SARS CORONAVIRUS 2 BY RT PCR (HOSPITAL ORDER, [place] [facility])  CULTURE, BLOOD (ROUTINE X 2)  CULTURE, BLOOD (ROUTINE X 2)  URINE CULTURE  PROTIME-INR  PROCALCITONIN  LACTIC ACID, PLASMA   ____________________________________________  EKG  ED ECG REPORT I, Tyjah Hai J, the attending physician, personally viewed and interpreted this ECG.   Date: 07/24/2019  EKG Time: 2331  Rate: 77  Rhythm: normal EKG, normal sinus rhythm  Axis: Normal  Intervals:none  ST&T Change: Nonspecific  ____________________________________________  RADIOLOGY  ED MD interpretation: No acute cardiopulmonary process; CT abdomen/pelvis unremarkable  Official radiology report(s): CT Abdomen Pelvis W Contrast  Result Date: 07/25/2019 CLINICAL DATA:  Abdominal pain. EXAM: CT ABDOMEN AND PELVIS WITH CONTRAST TECHNIQUE: Multidetector CT imaging of the abdomen and pelvis was performed using the standard protocol following bolus administration of intravenous contrast. CONTRAST:  129mL OMNIPAQUE IOHEXOL 300 MG/ML  SOLN COMPARISON:  CT dated 10/09/2018 FINDINGS: Lower chest: There is some subpleural reticulation and scarring at the lung bases. 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 Hepatobiliary: The liver is normal. Normal gallbladder.There is no biliary ductal dilation. Pancreas: Normal contours without ductal dilatation. No peripancreatic fluid collection. Spleen: The spleen is enlarged measuring approximately 14 cm craniocaudad. This is similar to prior study. Adrenals/Urinary Tract: --Adrenal  glands: Unremarkable. --Right kidney/ureter: No hydronephrosis or radiopaque kidney stones. --Left kidney/ureter: There are postsurgical changes related to prior partial resection of the left kidney. There is no left-sided hydronephrosis. There is a small fluid density collection in the left  retroperitoneal space measuring approximately 2.5 cm. This may represent a small postoperative seroma or hematoma. There is no significant soft tissue within the partial nephrectomy bed. --Urinary bladder: Unremarkable. Stomach/Bowel: --Stomach/Duodenum: No hiatal hernia or other gastric abnormality. Normal duodenal course and caliber. --Small bowel: Unremarkable. --Colon: Rectosigmoid diverticulosis without acute inflammation. --Appendix: Normal. Vascular/Lymphatic: Atherosclerotic calcification is present within the non-aneurysmal abdominal aorta, without hemodynamically significant stenosis. --No retroperitoneal lymphadenopathy. --No mesenteric lymphadenopathy. --No pelvic or inguinal lymphadenopathy. Reproductive: Unremarkable Other: No ascites or free air. The abdominal wall is normal. Musculoskeletal. No acute displaced fractures. IMPRESSION: 1. No acute abdominopelvic abnormality. 2. Rectosigmoid diverticulosis without acute inflammation. 3. Stable splenomegaly. 4. Large stool burden. Aortic Atherosclerosis (ICD10-I70.0). Electronically Signed   By: Katherine Mantlehristopher  Green M.D.   On: 07/25/2019 03:10   DG Chest Port 1 View  Result Date: 07/24/2019 CLINICAL DATA:  Abdominal pain EXAM: PORTABLE CHEST 1 VIEW COMPARISON:  Radiograph 10/17/2018 FINDINGS: Lung volumes are low. There are streaky basilar opacities which favor atelectasis. More focal bandlike opacities in the left mid to lower lung may reflect further subsegmental atelectasis or scarring. No focal consolidative opacity is seen. No pneumothorax or visible effusion. Enlarged cardiac silhouette, possibly accentuated by portable technique though similar to priors. The  aorta is calcified. The remaining cardiomediastinal contours are unremarkable. Telemetry leads overlie the chest. Prior reverse right shoulder arthroplasty and left shoulder hemiarthroplasty. Additional bilateral acromioclavicular arthrosis. Degenerative changes in the included portions of the spine. No acute osseous or soft tissue abnormality. IMPRESSION: 1. Low lung volumes with streaky basilar atelectasis. 2. More focal bandlike opacities in the left mid to lower lung may reflect further subsegmental atelectasis or scarring. 3. Cardiomegaly, similar to prior accounting for differences in technique. 4. No other acute cardiopulmonary abnormality. Electronically Signed   By: Kreg ShropshirePrice  DeHay M.D.   On: 07/24/2019 23:52    ____________________________________________   PROCEDURES  Procedure(s) performed (including Critical Care):  .1-3 Lead EKG Interpretation Performed by: Irean HongSung, Iverna Hammac J, MD Authorized by: Irean HongSung, Rachit Grim J, MD     Interpretation: normal     ECG rate:  75   ECG rate assessment: normal     Rhythm: sinus rhythm     Ectopy: none     Conduction: normal   Comments:     Patient placed on cardiac monitor to monitor for arrhythmias     ____________________________________________   INITIAL IMPRESSION / ASSESSMENT AND PLAN / ED COURSE  As part of my medical decision making, I reviewed the following data within the electronic MEDICAL RECORD NUMBER Nursing notes reviewed and incorporated, Labs reviewed, EKG interpreted, Old chart reviewed, Radiograph reviewed and Notes from prior ED visits     Charles Allison was evaluated in Emergency Department on 07/25/2019 for the symptoms described in the history of present illness. He was evaluated in the context of the global COVID-19 pandemic, which necessitated consideration that the patient might be at risk for infection with the SARS-CoV-2 virus that causes COVID-19. Institutional protocols and algorithms that pertain to the evaluation of patients at  risk for COVID-19 are in a state of rapid change based on information released by regulatory bodies including the CDC and federal and state organizations. These policies and algorithms were followed during the patient's care in the ED.    74 year old male who presents with abdominal pain. Differential diagnosis includes, but is not limited to, acute appendicitis, renal colic, testicular torsion, urinary tract infection/pyelonephritis, prostatitis,  epididymitis, diverticulitis, small bowel obstruction or ileus, colitis, abdominal aortic aneurysm,  gastroenteritis, hernia, etc.  Laboratory results demonstrate AKI, elevated lactic acid.  Patient febrile in triage. Will initiate ED sepsis protocol.  Initiate IV antibiotics.  Obtain CT abdomen/pelvis to evaluate for etiology of patient's symptoms.   Clinical Course as of Jul 24 532  Sun Jul 25, 2019  8099 Patient resting in no acute distress.  Updated him on CT scan results.  States he does feel full in his colon.  No reports of epigastric pain.  Agreeable to try soapsuds enema.  Lactic acid normalized after 2 L IV fluids.  Procalcitonin unremarkable.  Patient afebrile without antipyretics.  Doubt sepsis.     [JS]  [id number] Patient had several satisfactory bowel movements and is eager for discharge home.  Strict return precautions given.  Patient verbalizes understanding and agrees with plan of care.   [JS]    Clinical Course User Index [JS] Irean Hong, MD     ____________________________________________   FINAL CLINICAL IMPRESSION(S) / ED DIAGNOSES  Final diagnoses:  Fever, unspecified fever cause  Generalized abdominal pain  Acute pancreatitis, unspecified complication status, unspecified pancreatitis type  Constipation, unspecified constipation type     ED Discharge Orders         Ordered    oxyCODONE-acetaminophen (PERCOCET/ROXICET) 5-325 MG tablet  Every 4 hours PRN     07/25/19 0518    ondansetron (ZOFRAN ODT) 4 MG  disintegrating tablet  Every 8 hours PRN     07/25/19 0518    lactulose (CHRONULAC) 10 GM/15ML solution  Daily PRN     07/25/19 0518           Note:  This document was prepared using Dragon voice recognition software and may include unintentional dictation errors.   Irean Hong, MD 07/25/19 [phone]

## 2019-07-24 NOTE — ED Notes (Signed)
Pt states coming in for abdominal pain that comes and goes. Pt states it started real bad after dinner around 4:30pm. Pt denies recent surgeries and states he still has his gallbladder. Pt placed on cardiac, bp and pulse ox monitor. Pt states nausea, vomiting and diarrhea earlier in the weak, but nothing today

## 2019-07-25 ENCOUNTER — Encounter: Payer: Self-pay | Admitting: Radiology

## 2019-07-25 ENCOUNTER — Emergency Department: Payer: No Typology Code available for payment source

## 2019-07-25 LAB — PROTIME-INR
INR: 1.1 (ref 0.8–1.2)
Prothrombin Time: 13.4 seconds (ref 11.4–15.2)

## 2019-07-25 LAB — SARS CORONAVIRUS 2 BY RT PCR (HOSPITAL ORDER, PERFORMED IN ~~LOC~~ HOSPITAL LAB): SARS Coronavirus 2: NEGATIVE

## 2019-07-25 LAB — LACTIC ACID, PLASMA: Lactic Acid, Venous: 1.7 mmol/L (ref 0.5–1.9)

## 2019-07-25 LAB — PROCALCITONIN: Procalcitonin: <0.1 ng/mL

## 2019-07-25 MED ORDER — LACTULOSE 10 GM/15ML PO SOLN
20.0000 g | Freq: Every day | ORAL | 0 refills | Status: AC | PRN
Start: 2019-07-25 — End: ?

## 2019-07-25 MED ORDER — MAGNESIUM CITRATE PO SOLN
0.5000 | Freq: Once | ORAL | Status: AC
  Administered 2019-07-25: 0.5
  Filled 2019-07-25: qty 296

## 2019-07-25 MED ORDER — DOCUSATE SODIUM 50 MG/5ML PO LIQD
100.0000 mg | Freq: Once | ORAL | Status: AC
  Administered 2019-07-25: 100 mg
  Filled 2019-07-25: qty 10

## 2019-07-25 MED ORDER — OXYCODONE-ACETAMINOPHEN 5-325 MG PO TABS: 1.0000 | ORAL_TABLET | ORAL | 0 refills | Status: AC | PRN

## 2019-07-25 MED ORDER — SODIUM CHLORIDE 0.9 % IV BOLUS
1000.0000 mL | Freq: Once | INTRAVENOUS | Status: AC
  Administered 2019-07-25: 1000 mL via INTRAVENOUS

## 2019-07-25 MED ORDER — LACTULOSE 10 GM/15ML PO SOLN
30.0000 g | Freq: Once | ORAL | Status: AC
  Administered 2019-07-25: 30 g via ORAL
  Filled 2019-07-25: qty 60

## 2019-07-25 MED ORDER — ONDANSETRON 4 MG PO TBDP
4.0000 mg | ORAL_TABLET | Freq: Three times a day (TID) | ORAL | 0 refills | Status: AC | PRN
Start: 2019-07-25 — End: ?

## 2019-07-25 MED ORDER — IOHEXOL 300 MG/ML  SOLN
100.0000 mL | Freq: Once | INTRAMUSCULAR | Status: AC | PRN
  Administered 2019-07-25: 100 mL via INTRAVENOUS

## 2019-07-25 NOTE — ED Notes (Addendum)
Note entered in error

## 2019-07-25 NOTE — ED Notes (Signed)
Oxygen removed as pt is up

## 2019-07-25 NOTE — ED Notes (Addendum)
Provider notified that pt drank the magnesium citrate, colace and lactulose.Marland Kitchen Provider stated that it was okay and that the orders were not clear, but that we are good. Pt has had a bowel movement, and provider notified.

## 2019-07-25 NOTE — Progress Notes (Signed)
CODE SEPSIS - PHARMACY COMMUNICATION  **Broad Spectrum Antibiotics should be administered within 1 hour of Sepsis diagnosis**  Time Code Sepsis Called/Page Received: 2324  Antibiotics Ordered: Rocephin  Time of 1st antibiotic administration: 2355  Additional action taken by pharmacy: n/a  If necessary, Name of Provider/Nurse Contacted: none    Bettey Costa ,PharmD Clinical Pharmacist  07/25/2019  12:02 AM

## 2019-07-25 NOTE — ED Notes (Signed)
Pt placed on 2LPM via oxygen. Pt states he does use bipap when he sleeps. Provider notified

## 2019-07-25 NOTE — Discharge Instructions (Addendum)
1.  You may take Lactulose as needed for bowel movements. 2.  You may take medicines as needed for pain and nausea (Percocet/Zofran #20). 3.  Drink plenty of fluids daily. 4.  Return to the ER for worsening symptoms, persistent vomiting, difficulty breathing or other concerns.

## 2019-07-26 LAB — URINE CULTURE: Culture: <10000 — AB

## 2019-07-30 LAB — CULTURE, BLOOD (ROUTINE X 2)
Culture: NO GROWTH
Culture: NO GROWTH
Special Requests: ADEQUATE

## 2019-12-31 ENCOUNTER — Emergency Department: Payer: No Typology Code available for payment source

## 2019-12-31 ENCOUNTER — Emergency Department
Admission: EM | Admit: 2019-12-31 | Discharge: 2019-12-31 | Disposition: A | Discharge status: Home / Self Care | Payer: No Typology Code available for payment source | Attending: Emergency Medicine | Admitting: Emergency Medicine

## 2019-12-31 ENCOUNTER — Encounter: Payer: Self-pay | Admitting: Emergency Medicine

## 2019-12-31 ENCOUNTER — Other Ambulatory Visit: Payer: Self-pay

## 2019-12-31 DIAGNOSIS — Z96611 Presence of right artificial shoulder joint: Secondary | ICD-10-CM | POA: Insufficient documentation

## 2019-12-31 DIAGNOSIS — J449 Chronic obstructive pulmonary disease, unspecified: Secondary | ICD-10-CM | POA: Diagnosis not present

## 2019-12-31 DIAGNOSIS — R0602 Shortness of breath: Secondary | ICD-10-CM | POA: Diagnosis present

## 2019-12-31 DIAGNOSIS — E119 Type 2 diabetes mellitus without complications: Secondary | ICD-10-CM | POA: Diagnosis not present

## 2019-12-31 DIAGNOSIS — I10 Essential (primary) hypertension: Secondary | ICD-10-CM | POA: Diagnosis not present

## 2019-12-31 DIAGNOSIS — E039 Hypothyroidism, unspecified: Secondary | ICD-10-CM | POA: Insufficient documentation

## 2019-12-31 DIAGNOSIS — Z7984 Long term (current) use of oral hypoglycemic drugs: Secondary | ICD-10-CM | POA: Diagnosis not present

## 2019-12-31 DIAGNOSIS — Z79899 Other long term (current) drug therapy: Secondary | ICD-10-CM | POA: Insufficient documentation

## 2019-12-31 DIAGNOSIS — Z96651 Presence of right artificial knee joint: Secondary | ICD-10-CM | POA: Diagnosis not present

## 2019-12-31 DIAGNOSIS — Z87891 Personal history of nicotine dependence: Secondary | ICD-10-CM | POA: Insufficient documentation

## 2019-12-31 DIAGNOSIS — Z7982 Long term (current) use of aspirin: Secondary | ICD-10-CM | POA: Insufficient documentation

## 2019-12-31 LAB — CBC WITH DIFFERENTIAL/PLATELET
Abs Immature Granulocytes: 0.05 10*3/uL (ref 0.00–0.07)
Basophils Absolute: 0.1 10*3/uL (ref 0.0–0.1)
Basophils Relative: 1 %
Eosinophils Absolute: 0.3 10*3/uL (ref 0.0–0.5)
Eosinophils Relative: 4 %
HCT: 46.8 % (ref 39.0–52.0)
Hemoglobin: 15.7 g/dL (ref 13.0–17.0)
Immature Granulocytes: 1 %
Lymphocytes Relative: 15 %
Lymphs Abs: 1.3 10*3/uL (ref 0.7–4.0)
MCH: 28.8 pg (ref 26.0–34.0)
MCHC: 33.5 g/dL (ref 30.0–36.0)
MCV: 85.9 fL (ref 80.0–100.0)
Monocytes Absolute: 0.6 10*3/uL (ref 0.1–1.0)
Monocytes Relative: 7 %
Neutro Abs: 6.3 10*3/uL (ref 1.7–7.7)
Neutrophils Relative %: 72 %
Platelets: 177 10*3/uL (ref 150–400)
RBC: 5.45 MIL/uL (ref 4.22–5.81)
RDW: 14.3 % (ref 11.5–15.5)
WBC: 8.6 10*3/uL (ref 4.0–10.5)
nRBC: 0 % (ref 0.0–0.2)

## 2019-12-31 LAB — COMPREHENSIVE METABOLIC PANEL
ALT: 35 U/L (ref 0–44)
AST: 25 U/L (ref 15–41)
Albumin: 4.3 g/dL (ref 3.5–5.0)
Alkaline Phosphatase: 53 U/L (ref 38–126)
Anion gap: 12 (ref 5–15)
BUN: 27 mg/dL — ABNORMAL HIGH (ref 8–23)
CO2: 26 mmol/L (ref 22–32)
Calcium: 9.8 mg/dL (ref 8.9–10.3)
Chloride: 95 mmol/L — ABNORMAL LOW (ref 98–111)
Creatinine, Ser: 1.22 mg/dL (ref 0.61–1.24)
GFR, Estimated: >60 mL/min (ref >60)
Glucose, Bld: 215 mg/dL — ABNORMAL HIGH (ref 70–99)
Potassium: 4.3 mmol/L (ref 3.5–5.1)
Sodium: 133 mmol/L — ABNORMAL LOW (ref 135–145)
Total Bilirubin: 1.4 mg/dL — ABNORMAL HIGH (ref 0.3–1.2)
Total Protein: 8.2 g/dL — ABNORMAL HIGH (ref 6.5–8.1)

## 2019-12-31 LAB — CBG MONITORING, ED: Glucose-Capillary: 224 mg/dL — ABNORMAL HIGH (ref 70–99)

## 2019-12-31 MED ORDER — DEXAMETHASONE SODIUM PHOSPHATE 10 MG/ML IJ SOLN
10.0000 mg | Freq: Once | INTRAMUSCULAR | Status: AC
  Administered 2019-12-31: 10 mg via INTRAVENOUS
  Filled 2019-12-31: qty 1

## 2019-12-31 MED ORDER — IPRATROPIUM-ALBUTEROL 0.5-2.5 (3) MG/3ML IN SOLN
3.0000 mL | Freq: Once | RESPIRATORY_TRACT | Status: AC
  Administered 2019-12-31: 3 mL via RESPIRATORY_TRACT
  Filled 2019-12-31: qty 3

## 2019-12-31 MED ORDER — ALBUTEROL SULFATE HFA 108 (90 BASE) MCG/ACT IN AERS: 2.0000 | INHALATION_SPRAY | Freq: Four times a day (QID) | RESPIRATORY_TRACT | 2 refills | Status: AC | PRN

## 2019-12-31 MED ORDER — IOHEXOL 350 MG/ML SOLN
100.0000 mL | Freq: Once | INTRAVENOUS | Status: AC | PRN
  Administered 2019-12-31: 100 mL via INTRAVENOUS
  Filled 2019-12-31: qty 100

## 2019-12-31 NOTE — ED Notes (Signed)
See triage note. Dyspnea with exertion noted. Pt AOX4, NAD noted. No cough noted presently.

## 2019-12-31 NOTE — ED Provider Notes (Signed)
[facility] Emergency Department Provider Note  ____________________________________________   First MD Initiated Contact with Patient 12/31/19 1512     (approximate)  I have reviewed the triage vital signs and the nursing notes.   HISTORY  Chief Complaint Fever  HPI Charles Allison is a 74 y.o. male reports emergency department for evaluation of shortness of breath, productive cough, fever, body aches and chills.  He states that he initially became ill approximately 2 weeks ago with nasal congestion and sore throat and was evaluated by an outside provider.  At that time, he was prescribed Augmentin as well as a steroid which he says initially significantly improved his symptoms.  After he finished the initial course of Augmentin, he states that his symptoms came back and worse than before.  He was evaluated again and received a bit and flu testing at that time which was negative.  He was also previously tested for Covid by the [place] which was negative.  The second provider placed him on another round of Augmentin with prednisone and he has been on this for 2 days without much noted improvement this time.  Today, he states that he was feeling warm and checked his temperature which was 101.5 at which time he took Tylenol.  He has also been using he has been utilizing Coricidin and Flonase without relief.  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  The patient denies chest pain, headache, abdominal pain.  He denies known history of CHF, COPD, asthma, however he does state that he was exposed to agent orange during his time in [place].         Past Medical History:  Diagnosis Date  . Arthritis   . BPH (benign prostatic hyperplasia)   . Depression   . Diabetes mellitus without complication (HCC)   . Dyspnea   . H/O hiatal hernia   . Hx of  cardiomegaly   . [facility]  . Hypothyroidism   . Sleep apnea    bi pap    > 5 yrs  last sleep study    Patient Active Problem List   Diagnosis Date Noted  . 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 carpel tu     bil  . HERNIA REPAIR    . JOINT REPLACEMENT     lt shoulder, lt knee  . NEPHRECTOMY     partial  . REVERSE SHOULDER ARTHROPLASTY Right 03/20/2016   Procedure: REVERSE SHOULDER ARTHROPLASTY;  Surgeon: Beverely Low, MD;  Location: [facility] OR;  Service: Orthopedics;  Laterality: Right;  . SHOULDER SURGERY     LEFT  . TOTAL KNEE ARTHROPLASTY  02/17/2012   Procedure: TOTAL KNEE ARTHROPLASTY;  Surgeon: Dannielle Huh, MD;  Location: [facility] OR;  Service: Orthopedics;  Laterality: Right;  . TOTAL KNEE ARTHROPLASTY  02/17/2012   RIGHT     Prior to Admission medications   Medication Sig Start Date End Date Taking? Authorizing Provider  albuterol (VENTOLIN HFA) 108 (90 Base) MCG/ACT inhaler Inhale 2 puffs into the lungs every 6 (six) hours as needed for wheezing or shortness of breath. 12/31/19   Lucy Chris, PA  aspirin EC 81 MG tablet Take 81 mg by mouth daily.    [provider]  clonazePAM Scarlette Calico)  0.5 MG tablet Take 0.5 mg by mouth at bedtime.    [provider]  Cyanocobalamin (B-12) 1000 MCG TBCR Take 5,000 mcg by mouth daily.     [provider]  donepezil (ARICEPT) 10 MG tablet Take 10 mg by mouth at bedtime.    [provider]  Flaxseed, Linseed, (FLAXSEED OIL) 1000 MG CAPS Take 1 capsule by mouth 2 (two) times daily.    [provider]  FLUoxetine (PROZAC) 20 MG capsule Take 60 mg by mouth daily.    [provider]  glipiZIDE (GLUCOTROL) 10 MG tablet Take 10 mg by mouth 2 (two) times daily before a meal. 2 tabs in the am, 1 tab in the evening     [provider]  lactulose (CHRONULAC) 10 GM/15ML solution Take 30 mLs (20 g  total) by mouth daily as needed for mild constipation. 07/25/19   Irean Hong, MD  levothyroxine (SYNTHROID, LEVOTHROID) 50 MCG tablet Take 50 mcg by mouth daily before breakfast.    [provider]  losartan (COZAAR) 50 MG tablet Take 25 mg by mouth daily.    [provider]  metFORMIN (GLUCOPHAGE) 1000 MG tablet Take 1,000 mg by mouth 2 (two) times daily with a meal.    [provider]  methocarbamol (ROBAXIN) 500 MG tablet Take 1 tablet (500 mg total) by mouth 3 (three) times daily as needed. Patient not taking: Reported on 07/25/2019 03/20/16   Beverely Low, MD  metoprolol (LOPRESSOR) 50 MG tablet Take 25 mg by mouth 2 (two) times daily.    [provider]  NUTRITIONAL SUPPLEMENTS PO Take 3 capsules by mouth daily. SUPER GREENS    [provider]  NUTRITIONAL SUPPLEMENTS PO Take 3 capsules by mouth daily. BOOST For Focus and Energy    [provider]  NUTRITIONAL SUPPLEMENTS PO Take 1 capsule by mouth 2 (two) times daily. WELL SPRING    [provider]  omeprazole (PRILOSEC) 20 MG capsule Take 20 mg by mouth 2 (two) times daily before a meal.     [provider]  ondansetron (ZOFRAN ODT) 4 MG disintegrating tablet Take 1 tablet (4 mg total) by mouth every 8 (eight) hours as needed for nausea or vomiting. 07/25/19   Irean Hong, MD  oxyCODONE-acetaminophen (PERCOCET/ROXICET) 5-325 MG tablet Take 1 tablet by mouth every 4 (four) hours as needed for severe pain. 07/25/19   Irean Hong, MD  pioglitazone (ACTOS) 45 MG tablet Take 45 mg by mouth daily.    [provider]  pravastatin (PRAVACHOL) 20 MG tablet Take 60 mg by mouth at bedtime.     [provider]  ranitidine (ZANTAC) 300 MG tablet Take 300 mg by mouth at bedtime.    [provider]  tamsulosin (FLOMAX) 0.4 MG CAPS capsule Take 0.4 mg by mouth at bedtime.    [provider]  traZODone (DESYREL) 100 MG tablet Take 100 mg by mouth at  bedtime.    [provider]    Allergies Other  History reviewed. No pertinent family history.  Social History Social History   Tobacco Use  . Smoking status: Former Smoker    Quit date: 02/14/2000    Years since quitting: 19.8  . Smokeless tobacco: Never Used  Substance Use Topics  . Alcohol use: Yes    Comment: mo  . Drug use: No    Review of Systems Constitutional: + fever/chills Eyes: No visual changes. ENT: + sore throat. Cardiovascular: Denies  chest pain. Respiratory: + shortness of breath, . Gastrointestinal: No abdominal pain.  No nausea, no vomiting.  No diarrhea.  No constipation. Genitourinary: Negative for dysuria. Musculoskeletal: Negative for back pain. Skin: Negative for rash. Neurological: Negative for headaches, focal weakness or numbness.  ____________________________________________   PHYSICAL EXAM:  VITAL SIGNS: ED Triage Vitals  Enc Vitals Group     BP 12/31/19 1339 118/76     Pulse Rate 12/31/19 1339 76     Resp 12/31/19 1339 18     Temp 12/31/19 1339 98.5 F (36.9 C)     Temp Source 12/31/19 1339 Oral     SpO2 12/31/19 1339 94 %     Weight 12/31/19 1340 220 lb (99.8 kg)     Height 12/31/19 1340 5' 10.5" (1.791 m)     Head Circumference --      Peak Flow --      Pain Score 12/31/19 1340 8     Pain Loc --      Pain Edu? --      Excl. in GC? --     Constitutional: Alert and oriented. Well appearing and in no acute distress. Eyes: Conjunctivae are normal. PERRL. EOMI. Head: Atraumatic. Nose: Mild congestion/rhinnorhea. Mouth/Throat: Mucous membranes are moist.  Oropharynx non-erythematous. Neck: No stridor.   Cardiovascular: Normal rate, regular rhythm.  Good peripheral circulation. Respiratory: Normal respiratory effort.  No retractions.  Coarse breath sounds heard throughout all lung fields, more prominent in the bases without any particular wheezes, crackles or rhonchi Gastrointestinal: Soft and nontender. No  distention. No abdominal bruits. No CVA tenderness. Musculoskeletal: No lower extremity tenderness nor edema.  No joint effusions. Neurologic:  Normal speech and language. No gross focal neurologic deficits are appreciated. No gait instability. Skin:  Skin is warm, dry and intact. No rash noted. Psychiatric: Mood and affect are normal. Speech and behavior are normal.  ____________________________________________   LABS (all labs ordered are listed, but only abnormal results are displayed)  Labs Reviewed  COMPREHENSIVE METABOLIC PANEL - Abnormal; Notable for the following components:      Result Value   Sodium 133 (*)    Chloride 95 (*)    Glucose, Bld 215 (*)    BUN 27 (*)    Total Protein 8.2 (*)    Total Bilirubin 1.4 (*)    All other components within normal limits  CBG MONITORING, ED - Abnormal; Notable for the following components:   Glucose-Capillary 224 (*)    All other components within normal limits  CBC WITH DIFFERENTIAL/PLATELET   ____________________________________________  EKG  EKG reveals normal sinus rhythm of a rate in the upper 60s.  There is some T wave flattening in the septal leads without any noted abnormalities in the other leads.  No ST depressions or elevations.  No findings of acute ischemia. ____________________________________________  RADIOLOGY I, Lucy Chris, personally viewed and evaluated these images (plain radiographs) as part of my medical decision making, as well as reviewing the written report by the radiologist.  ED provider interpretation: No acute pneumonia identified  Official radiology report(s): DG Chest 2 View  Result Date: 12/31/2019 CLINICAL DATA:  Cough fever EXAM: CHEST - 2 VIEW COMPARISON:  07/24/2019 FINDINGS: Bilateral shoulder replacements. Probable trace right pleural effusion. No consolidation. Mild bronchitic changes. Normal cardiomediastinal silhouette with aortic atherosclerosis. No pneumothorax. IMPRESSION:  Probable trace right pleural effusion. Mild bronchitic changes. Electronically Signed   By: Jasmine Pang M.D.   On: 12/31/2019 15:10   CT Angio  Chest PE W and/or Wo Contrast  Result Date: 12/31/2019 CLINICAL DATA:  Chest pain and shortness of breath. Pleurisy versus effusion suspected. EXAM: CT ANGIOGRAPHY CHEST WITH CONTRAST TECHNIQUE: Multidetector CT imaging of the chest was performed using the standard protocol during bolus administration of intravenous contrast. Multiplanar CT image reconstructions and MIPs were obtained to evaluate the vascular anatomy. CONTRAST:  OMNIPAQUE IOHEXOL 350 MG/ML SOLN COMPARISON:  CT of the abdomen and pelvis on 07/25/2019 FINDINGS: Cardiovascular: Heart size is normal. There is no pericardial effusion. Stable pericardial cyst stable pericardial cyst is 2.3 centimeters. Status post mitral annuloplasty. Coronary artery calcifications are present. Thoracic aorta is partially calcified without evidence for aneurysm. The pulmonary arteries are well opacified by contrast bolus. There is no acute pulmonary embolus. Mediastinum/Nodes: Esophagus is unremarkable. Small RIGHT hilar lymph nodes are present, measuring up to 1.4 centimeters in short axis. The visualized portion of the thyroid gland has a normal appearance. Lungs/Pleura: There is panlobular emphysema. Reticular changes are identified within the periphery of the lungs, primarily at the lung bases. There are no focal consolidations. No pleural effusions or evidence for pulmonary edema. The airways are patent. Upper Abdomen: Gallbladder is present. No acute UPPER abdominal abnormality. Musculoskeletal: Mild midthoracic spondylosis. Review of the MIP images confirms the above findings. IMPRESSION: 1. Technically adequate exam showing no acute pulmonary embolus. 2. Coronary artery disease. 3. Stable pericardial cyst. 4. Stable, benign RIGHT hilar lymph nodes. 5. Aortic Atherosclerosis (ICD10-I70.0) and Emphysema  (ICD10-J43.9). Electronically Signed   By: Norva Pavlov M.D.   On: 12/31/2019 18:09    ____________________________________________   INITIAL IMPRESSION / ASSESSMENT AND PLAN / ED COURSE  As part of my medical decision making, I reviewed the following data within the electronic MEDICAL RECORD NUMBER Nursing notes reviewed and incorporated, EKG interpreted, Radiograph reviewed  and discussed with attending        Patient is a 74 year old male who presents emergency department for evaluation of 2 weeks of illness including sore throat, nasal congestion, shortness of breath and today of fever.  The patient has been seen by multiple providers and tested multiple times for Covid as well as flu which is all been negative.  He is now on his second round of Augmentin without relief during this course.  He is also taking steroid without significant relief.  Initial work-up includes chest x-ray, CBC and CMP.  Chest x-ray does not identify any acute pneumonia, and labs are otherwise reassuring.  Discussed with Dr. Cyril Loosen, given a clinical presentation similar to pneumonia without any chest x-ray or laboratory findings.  He recommended proceeding with CT angio to rule out PE or other intrathoracic finding.  CT results acute PE, but does define some emphysematous changes in the lungs.  Given this, as well as a history in his chart of being treated with chronic bronchitis, he is more likely to have the symptoms from emphysema versus COPD.  Will attempt treating the patient with a DuoNeb as well as IV steroids.  No indication for antibiotic therapy at this time, particularly given his history with C. difficile.  After DuoNeb treatment, the patient does state that he notices some mild improvement in his respirations.  I will prescribe the patient outpatient albuterol and will have him follow-up with his primary care for further management of chronic airway disease.  The patient is amenable with this plan.  He will  return to the emergency department for any worsening.      ____________________________________________   FINAL  CLINICAL IMPRESSION(S) / ED DIAGNOSES  Final diagnoses:  Emphysema with chronic bronchitis [facility](HCC)     ED Discharge Orders         Ordered    albuterol (VENTOLIN HFA) 108 (90 Base) MCG/ACT inhaler  Every 6 hours PRN        12/31/19 1937          *Please note:  Jennefer Bravolton R Franko was evaluated in Emergency Department on 12/31/2019 for the symptoms described in the history of present illness. He was evaluated in the context of the global COVID-19 pandemic, which necessitated consideration that the patient might be at risk for infection with the SARS-CoV-2 virus that causes COVID-19. Institutional protocols and algorithms that pertain to the evaluation of patients at risk for COVID-19 are in a state of rapid change based on information released by regulatory bodies including the CDC and federal and state organizations. These policies and algorithms were followed during the patient's care in the ED.  Some ED evaluations and interventions may be delayed as a result of limited staffing during and the pandemic.*   Note:  This document was prepared using Dragon voice recognition software and may include unintentional dictation errors.    Lucy Chrisodgers, Anusha Claus J, PA 01/01/20 0009    Gilles ChiquitoSmith, Zachary P, MD 01/01/20 [phone]

## 2019-12-31 NOTE — ED Triage Notes (Addendum)
Pt via POV. Pt st COVID tested and flu swab on Wednesday at [facility]. Sent home with a Rx for Amoxillin and Predison.   * Results for COVID and flu are NEGATIVE- results can be  found under Care Everywhere.    Pt reports 101.1 today at 1130, took tylenol OTC. Presents to ED with 98.5 today. C/o productive cough, fever, body aches, chills, sore throat, SHOB, intermittent vomitting. Denies CP.   St sx's started 1 week ago.

## 2020-01-02 ENCOUNTER — Emergency Department
Admission: EM | Admit: 2020-01-02 | Discharge: 2020-01-02 | Disposition: A | Discharge status: Home / Self Care | Payer: No Typology Code available for payment source | Attending: Emergency Medicine | Admitting: Emergency Medicine

## 2020-01-02 ENCOUNTER — Other Ambulatory Visit: Payer: Self-pay

## 2020-01-02 ENCOUNTER — Emergency Department: Payer: No Typology Code available for payment source

## 2020-01-02 DIAGNOSIS — S90911A Unspecified superficial injury of right ankle, initial encounter: Secondary | ICD-10-CM | POA: Diagnosis present

## 2020-01-02 DIAGNOSIS — S93401A Sprain of unspecified ligament of right ankle, initial encounter: Secondary | ICD-10-CM | POA: Diagnosis not present

## 2020-01-02 DIAGNOSIS — W19XXXA Unspecified fall, initial encounter: Secondary | ICD-10-CM

## 2020-01-02 DIAGNOSIS — Y92009 Unspecified place in unspecified non-institutional (private) residence as the place of occurrence of the external cause: Secondary | ICD-10-CM | POA: Insufficient documentation

## 2020-01-02 DIAGNOSIS — Z7984 Long term (current) use of oral hypoglycemic drugs: Secondary | ICD-10-CM | POA: Diagnosis not present

## 2020-01-02 DIAGNOSIS — Z96653 Presence of artificial knee joint, bilateral: Secondary | ICD-10-CM | POA: Diagnosis not present

## 2020-01-02 DIAGNOSIS — W010XXA Fall on same level from slipping, tripping and stumbling without subsequent striking against object, initial encounter: Secondary | ICD-10-CM | POA: Insufficient documentation

## 2020-01-02 DIAGNOSIS — Z96611 Presence of right artificial shoulder joint: Secondary | ICD-10-CM | POA: Insufficient documentation

## 2020-01-02 DIAGNOSIS — Z7982 Long term (current) use of aspirin: Secondary | ICD-10-CM | POA: Insufficient documentation

## 2020-01-02 DIAGNOSIS — E039 Hypothyroidism, unspecified: Secondary | ICD-10-CM | POA: Insufficient documentation

## 2020-01-02 DIAGNOSIS — I10 Essential (primary) hypertension: Secondary | ICD-10-CM | POA: Diagnosis not present

## 2020-01-02 DIAGNOSIS — S300XXA Contusion of lower back and pelvis, initial encounter: Secondary | ICD-10-CM | POA: Insufficient documentation

## 2020-01-02 DIAGNOSIS — Z79899 Other long term (current) drug therapy: Secondary | ICD-10-CM | POA: Insufficient documentation

## 2020-01-02 DIAGNOSIS — Z87891 Personal history of nicotine dependence: Secondary | ICD-10-CM | POA: Diagnosis not present

## 2020-01-02 DIAGNOSIS — E119 Type 2 diabetes mellitus without complications: Secondary | ICD-10-CM | POA: Insufficient documentation

## 2020-01-02 MED ORDER — OXYCODONE HCL 5 MG PO CAPS: 5.0000 mg | ORAL_CAPSULE | Freq: Four times a day (QID) | ORAL | 0 refills | Status: AC | PRN

## 2020-01-02 MED ORDER — OXYCODONE HCL 5 MG PO TABS
10.0000 mg | ORAL_TABLET | Freq: Once | ORAL | Status: AC
  Administered 2020-01-02: 10 mg via ORAL
  Filled 2020-01-02: qty 2

## 2020-01-02 MED ORDER — ACETAMINOPHEN 500 MG PO TABS
ORAL_TABLET | ORAL | Status: AC
  Administered 2020-01-02: 1000 mg via ORAL
  Filled 2020-01-02: qty 2

## 2020-01-02 MED ORDER — ACETAMINOPHEN 500 MG PO TABS: 1000.0000 mg | ORAL_TABLET | Freq: Once | ORAL | Status: AC

## 2020-01-02 NOTE — ED Notes (Signed)
Pt states "tylenol doesn't do a god damn thing for me, can't you ask the doctor for a tramdol, god damn." pt informed that MD will need to assess for further pain medication orders and pt is currently waiting for a treatment room. Pt took tylenol.

## 2020-01-02 NOTE — ED Notes (Addendum)
This RN walking past subwait. Pt yelling requesting pain medication. Dr. Vicente Males notified, order for po tylenol received.

## 2020-01-02 NOTE — Discharge Instructions (Addendum)
Follow-up with your primary care provider if any continued problems or concerns.  X-rays today did not show any fracture of your back or your right ankle.  With your arthritis you may have some moderate soreness and stiffness secondary to your fall.  Take pain medication only as directed and be aware that this could increase your risk for being drowsy and falling.  Take Tylenol if mild pain medication as needed.  If any additional pain medication is needed you will need to contact your primary care provider.  Ice and elevate your ankle as needed for pain and swelling.  The Ace wrap is for support and protection.

## 2020-01-02 NOTE — ED Notes (Signed)
Pt presents to the ED for a fall. Pt states that he had a mechanical fall in his bathroom and has had lower back pain since then. Pt also that he has been having a cough that he was seen for recently. Pt states "I need another steroid shot like they gave me last night." Pt A&Ox4 and NAD.

## 2020-01-02 NOTE — ED Triage Notes (Signed)
Patient reports up to go to bathroom and lost his balance and fell on Saturday.  Patient now reporting lower back pain.

## 2020-01-02 NOTE — ED Provider Notes (Signed)
[facility] Emergency Department Provider Note   ____________________________________________   First MD Initiated Contact with Patient 01/02/20 [phone]     (approximate)  I have reviewed the triage vital signs and the nursing notes.   HISTORY  Chief Complaint Fall and Back Pain   HPI Jennefer Bravolton R Insley is a 74 y.o. male resents to the ED with complaint of mechanical fall that occurred during the night when he got up to go to the bathroom.  Patient states that he lost his balance and fell.  Patient denies any head injury or injury to his neck.  He states that his lower back and right ankle are the extent of his injuries.  He denies any change in vision, nausea, vomiting or headache.  Patient was given oxycodone prior to being seen.  He states that at this moment he has no pain.  Prior to medication he rated his pain as a 10/10.      Past Medical History:  Diagnosis Date  . Arthritis   . BPH (benign prostatic hyperplasia)   . Depression   . Diabetes mellitus without complication (HCC)   . Dyspnea   . H/O hiatal hernia   . Hx of cardiomegaly   . [facility]  . Hypothyroidism   . Sleep apnea    bi pap    > 5 yrs  last sleep study    Patient Active Problem List   Diagnosis Date Noted  . 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 carpel tu     bil  . HERNIA REPAIR    . JOINT REPLACEMENT     lt shoulder, lt knee  . NEPHRECTOMY     partial  . REVERSE SHOULDER ARTHROPLASTY Right 03/20/2016   Procedure: REVERSE SHOULDER ARTHROPLASTY;  Surgeon: Beverely LowSteve Norris, MD;  [facility];  Service: Orthopedics;  Laterality: Right;  . SHOULDER SURGERY     LEFT  . TOTAL KNEE ARTHROPLASTY  02/17/2012   Procedure: TOTAL KNEE ARTHROPLASTY;  Surgeon: Dannielle HuhSteve Lucey, MD;  Location: [facility] OR;  Service: Orthopedics;  Laterality: Right;  . TOTAL KNEE ARTHROPLASTY  02/17/2012   RIGHT      Prior to Admission medications   Medication Sig Start Date End Date Taking? Authorizing Provider  albuterol (VENTOLIN HFA) 108 (90 Base) MCG/ACT inhaler Inhale 2 puffs into the lungs every 6 (six) hours as needed for wheezing or shortness of breath. 12/31/19   Lucy Chrisodgers, Caitlin J, PA  aspirin EC 81 MG tablet Take 81 mg by mouth daily.    [provider]  clonazePAM (KLONOPIN) 0.5 MG tablet Take 0.5 mg by mouth at bedtime.    [provider]  Cyanocobalamin (B-12) 1000 MCG TBCR Take 5,000 mcg by mouth daily.     [provider]  donepezil (ARICEPT) 10 MG tablet Take 10 mg by mouth at bedtime.    [provider]  Flaxseed, Linseed, (FLAXSEED OIL) 1000 MG CAPS Take 1 capsule by mouth 2 (two) times daily.    [provider]  FLUoxetine (PROZAC) 20 MG capsule Take 60 mg by mouth daily.    [provider]  glipiZIDE (GLUCOTROL) 10 MG tablet Take 10 mg by mouth 2 (two) times daily before a meal. 2 tabs in the am, 1 tab in the evening     [provider]  lactulose (CHRONULAC) 10 GM/15ML solution Take 30  mLs (20 g total) by mouth daily as needed for mild constipation. 07/25/19   Irean Hong, MD  levothyroxine (SYNTHROID, LEVOTHROID) 50 MCG tablet Take 50 mcg by mouth daily before breakfast.    [provider]  losartan (COZAAR) 50 MG tablet Take 25 mg by mouth daily.    [provider]  metFORMIN (GLUCOPHAGE) 1000 MG tablet Take 1,000 mg by mouth 2 (two) times daily with a meal.    [provider]  methocarbamol (ROBAXIN) 500 MG tablet Take 1 tablet (500 mg total) by mouth 3 (three) times daily as needed. Patient not taking: Reported on 07/25/2019 03/20/16   Beverely Low, MD  metoprolol (LOPRESSOR) 50 MG tablet Take 25 mg by mouth 2 (two) times daily.    [provider]  NUTRITIONAL SUPPLEMENTS PO Take 3 capsules by mouth daily. SUPER GREENS    [provider]  NUTRITIONAL SUPPLEMENTS PO Take 3  capsules by mouth daily. BOOST For Focus and Energy    [provider]  NUTRITIONAL SUPPLEMENTS PO Take 1 capsule by mouth 2 (two) times daily. WELL SPRING    [provider]  omeprazole (PRILOSEC) 20 MG capsule Take 20 mg by mouth 2 (two) times daily before a meal.     [provider]  ondansetron (ZOFRAN ODT) 4 MG disintegrating tablet Take 1 tablet (4 mg total) by mouth every 8 (eight) hours as needed for nausea or vomiting. 07/25/19   Irean Hong, MD  oxycodone (OXY-IR) 5 MG capsule Take 1 capsule (5 mg total) by mouth every 6 (six) hours as needed for pain. 01/02/20   Tommi Rumps, PA-C  oxyCODONE-acetaminophen (PERCOCET/ROXICET) 5-325 MG tablet Take 1 tablet by mouth every 4 (four) hours as needed for severe pain. 07/25/19   Irean Hong, MD  pioglitazone (ACTOS) 45 MG tablet Take 45 mg by mouth daily.    [provider]  pravastatin (PRAVACHOL) 20 MG tablet Take 60 mg by mouth at bedtime.     [provider]  ranitidine (ZANTAC) 300 MG tablet Take 300 mg by mouth at bedtime.    [provider]  tamsulosin (FLOMAX) 0.4 MG CAPS capsule Take 0.4 mg by mouth at bedtime.    [provider]  traZODone (DESYREL) 100 MG tablet Take 100 mg by mouth at bedtime.    [provider]    Allergies Other  No family history on file.  Social History Social History   Tobacco Use  . Smoking status: Former Smoker    Quit date: 02/14/2000    Years since quitting: 19.8  . Smokeless tobacco: Never Used  Substance Use Topics  . Alcohol use: Yes    Comment: mo  . Drug use: No    Review of Systems Constitutional: No fever/chills Eyes: No visual changes. ENT: No trauma. Cardiovascular: Denies chest pain. Respiratory: Denies shortness of breath. Gastrointestinal: No abdominal pain.  No nausea, no vomiting.   Genitourinary: Negative for dysuria. Musculoskeletal: Positive for low back pain and right ankle pain. Skin:  Negative for rash. Neurological: Negative for headaches, focal weakness or numbness. ____________________________________________   PHYSICAL EXAM:  VITAL SIGNS: ED Triage Vitals [01/02/20 0442]  Enc Vitals Group     BP 136/89     Pulse Rate 68     Resp 20     Temp 98.3 F (36.8 C)     Temp Source Oral     SpO2 94 %     Weight 219 lb 12.8  oz (99.7 kg)     Height 5' 10.5" (1.791 m)     Head Circumference      Peak Flow      Pain Score 10     Pain Loc      Pain Edu?      Excl. in GC?     Constitutional: Alert and oriented. Well appearing and in no acute distress. Eyes: Conjunctivae are normal. PERRL. EOMI. Head: Atraumatic. Nose: No trauma. Neck: No stridor.  No cervical tenderness on palpation posteriorly. Cardiovascular: Normal rate, regular rhythm. Grossly normal heart sounds.  Good peripheral circulation. Respiratory: Normal respiratory effort.  No retractions. Lungs CTAB. Gastrointestinal: Soft and nontender. No distention.  Sounds normoactive x4 quadrants. Musculoskeletal: There is no point tenderness or pain with range of motion upper extremities.  Nontender thoracic or cervical spine.  Diffuse tenderness noted lumbar spine especially L5-S1 area.  Patient is able to move lower extremities without any difficulty or restriction.  There is some soft tissue edema noted to the lateral aspect of the right ankle.  No gross deformity is noted.  Range of motion is minimally restricted secondary to discomfort.  Skin is intact.  Pulses present distally.  Motor sensory function intact.  No tenderness to compression of the pelvis on exam. Neurologic:  Normal speech and language. No gross focal neurologic deficits are appreciated.  Skin:  Skin is warm, dry and intact.  No ecchymosis or abrasions were noted. Psychiatric: Mood and affect are normal. Speech and behavior are normal.  ____________________________________________   LABS (all labs ordered are listed, but only abnormal  results are displayed)  Labs Reviewed - No data to display ____________________________________________   RADIOLOGY I, Tommi Rumps, personally viewed and evaluated these images (plain radiographs) as part of my medical decision making, as well as reviewing the written report by the radiologist.   Official radiology report(s): DG Lumbar Spine 2-3 Views  Result Date: 01/02/2020 CLINICAL DATA:  Larey Seat yesterday while getting up and going to the bathroom. Patient complaining of back and pelvic pain and right ankle pain. EXAM: LUMBAR SPINE - 2-3 VIEW COMPARISON:  11/04/2018 FINDINGS: No fracture, bone lesion or spondylolisthesis. Moderate loss of disc height at L5-S1. Mild loss of disc height at L1-L2. Endplate spurring most evident at L1-L2. Remaining lumbar discs are well preserved. Dense calcification extends along the abdominal aorta and iliac vessels. Multiple surgical vascular clips in the left mid abdomen. IMPRESSION: 1. No fracture or acute finding. 2. Degenerative changes most evident at L5-S1. No change from the prior radiographs. Electronically Signed   By: Amie Portland M.D.   On: 01/02/2020 08:17   DG Pelvis 1-2 Views  Result Date: 01/02/2020 CLINICAL DATA:  Larey Seat yesterday while getting up and going to the bathroom. Patient complaining of back and pelvic pain and right ankle pain. EXAM: PELVIS - 1-2 VIEW COMPARISON:  None. FINDINGS: No fracture or bone lesion. Hip joints, SI joints and symphysis pubis are normally spaced and aligned. There scattered vascular calcifications. Soft tissues otherwise unremarkable. IMPRESSION: No fracture or dislocation. Electronically Signed   By: Amie Portland M.D.   On: 01/02/2020 08:18   DG Ankle Complete Right  Result Date: 01/02/2020 CLINICAL DATA:  Larey Seat yesterday while getting up and going to the bathroom. Patient complaining of back and pelvic pain and right ankle pain. EXAM: RIGHT ANKLE - COMPLETE 3+ VIEW COMPARISON:  None. FINDINGS: No  fracture or bone lesion. Ankle joint normally spaced and aligned. Mild lateral soft tissue swelling  suggested. There are vascular calcifications extending from the lower leg to the ankle. IMPRESSION: No fracture or dislocation. Electronically Signed   By: Amie Portland M.D.   On: 01/02/2020 08:16    ____________________________________________   PROCEDURES  Procedure(s) performed (including Critical Care):  Procedures Jones wrap to the right ankle applied by provider.  ____________________________________________   INITIAL IMPRESSION / ASSESSMENT AND PLAN / ED COURSE  As part of my medical decision making, I reviewed the following data within the electronic MEDICAL RECORD NUMBER Notes from prior ED visits and [place] Controlled Substance Database  74 year old male is brought to the ED after mechanical fall in the bathroom in the early hours of today.  Patient states that there was no head injury or loss of consciousness.  He does complain of low back pain and right ankle pain.  He has been ambulatory since his accident.  Patient was given oxycodone 10 mg prior to being examined and states that he has no pain anyplace else.  X-ray showed no acute changes and patient was made aware.  He is to follow-up with his PCP if any continued problems.  A prescription for oxycodone 5 mg 1 every 6 hours number 3 tablets was written.  Patient is aware that he will need to follow-up with his PCP if any continued pain medication is needed.  He is encouraged to use ice and elevation for his ankle and a Jones wrap was applied to the right ankle by myself..   ____________________________________________   FINAL CLINICAL IMPRESSION(S) / ED DIAGNOSES  Final diagnoses:  Lumbar contusion, initial encounter  Sprain of right ankle, unspecified ligament, initial encounter  Fall in home, initial encounter     ED Discharge Orders         Ordered    oxycodone (OXY-IR) 5 MG capsule  Every 6 hours PRN        01/02/20  0846          *Please note:  JOSEMIGUEL GRIES was evaluated in Emergency Department on 01/02/2020 for the symptoms described in the history of present illness. He was evaluated in the context of the global COVID-19 pandemic, which necessitated consideration that the patient might be at risk for infection with the SARS-CoV-2 virus that causes COVID-19. Institutional protocols and algorithms that pertain to the evaluation of patients at risk for COVID-19 are in a state of rapid change based on information released by regulatory bodies including the CDC and federal and state organizations. These policies and algorithms were followed during the patient's care in the ED.  Some ED evaluations and interventions may be delayed as a result of limited staffing during and the pandemic.*   Note:  This document was prepared using Dragon voice recognition software and may include unintentional dictation errors.    Tommi Rumps, PA-C 01/02/20 1141    Minna Antis, MD 01/02/20 1447

## 2020-02-02 DEATH — deceased

## 2022-02-17 IMAGING — CR DG LUMBAR SPINE 2-3V
3 series · 3 of 3 positions shown · non-contrast
Comparison: 11/04/2018

CLINICAL DATA: Fell yesterday while getting up and going to the
bathroom. Patient complaining of back and pelvic pain and right
ankle pain.

EXAM:
LUMBAR SPINE - 2-3 VIEW

[l-spine ap]
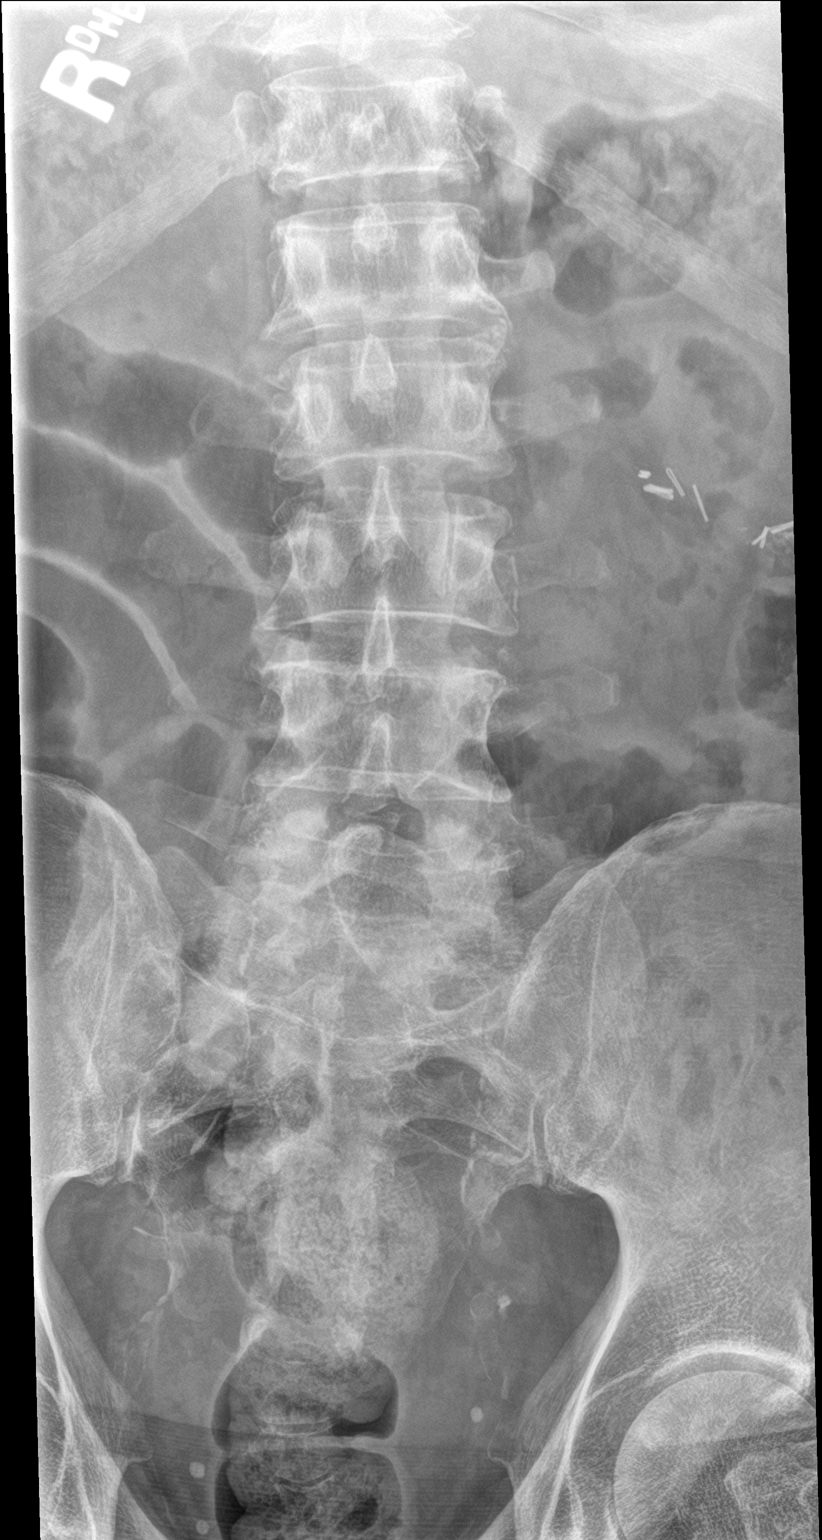

[l-spine lat]
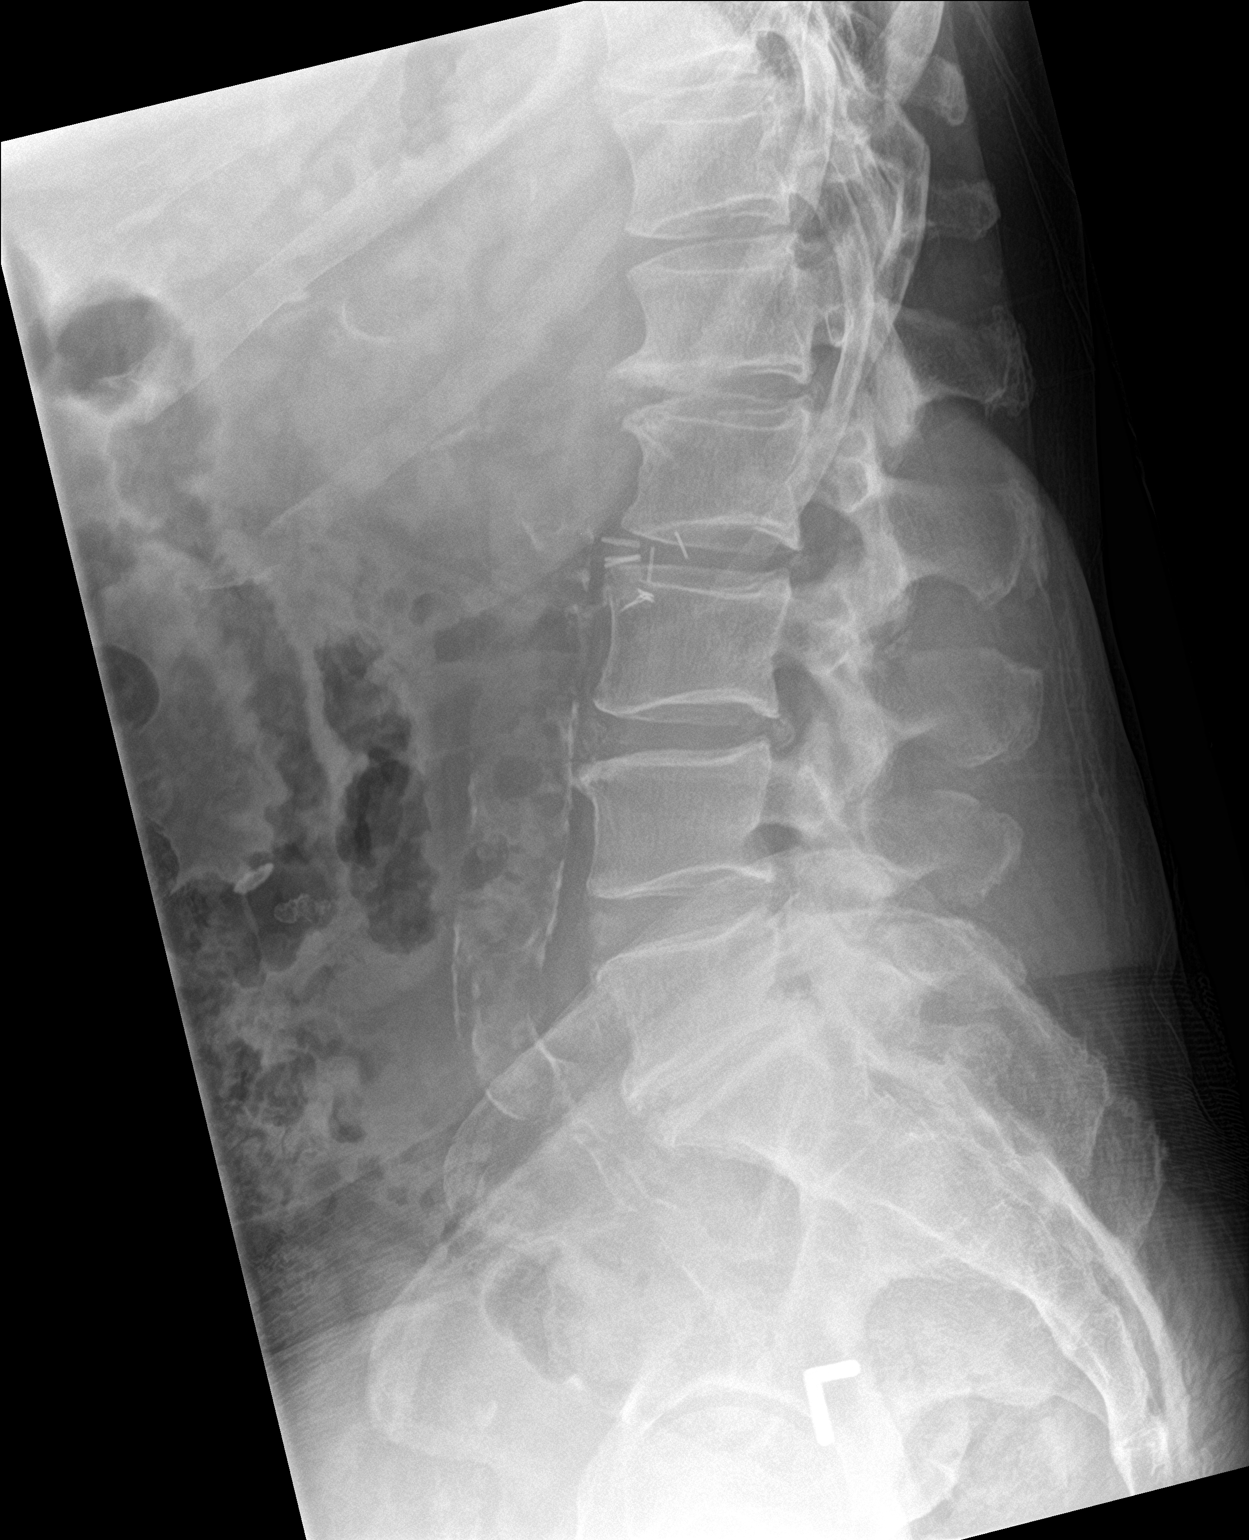

[l-spine spot]
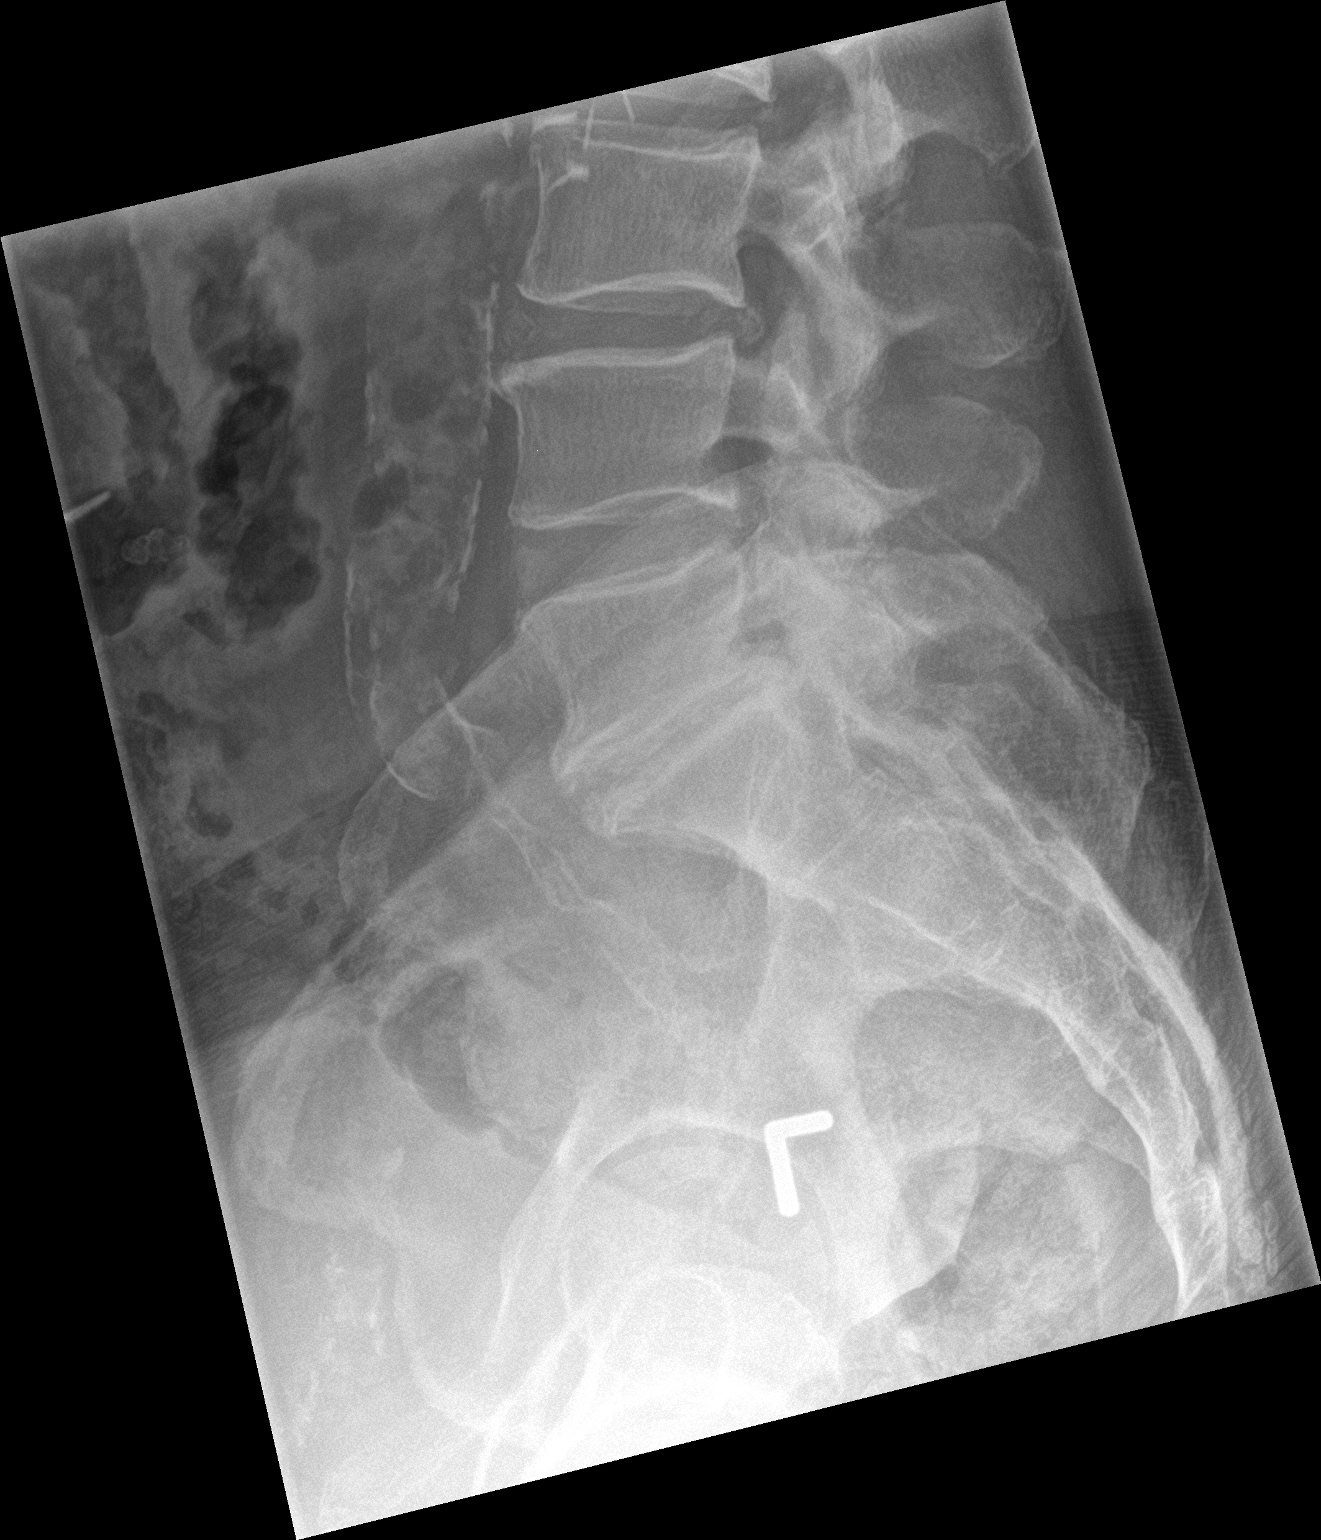

[3 of 3 positions shown; findings below may reference images not displayed]

FINDINGS: No fracture, bone lesion or spondylolisthesis.

Moderate loss of disc height at L5-S1. Mild loss of disc height at
L1-L2. Endplate spurring most evident at L1-L2. Remaining lumbar
discs are well preserved. Dense calcification extends along the
abdominal aorta and iliac vessels. Multiple surgical vascular clips
in the left mid abdomen.
IMPRESSION: 1. No fracture or acute finding.
2. Degenerative changes most evident at L5-S1. No change from the
prior radiographs.
